# Patient Record
Sex: Female | Born: 1950
Health system: Southern US, Community
[De-identification: ages and names within clinical notes are randomized; demographics above are authoritative.]

## PROBLEM LIST (undated history)

## (undated) DIAGNOSIS — D332 Benign neoplasm of brain, unspecified: Secondary | ICD-10-CM

## (undated) DIAGNOSIS — I10 Essential (primary) hypertension: Secondary | ICD-10-CM

## (undated) DIAGNOSIS — E785 Hyperlipidemia, unspecified: Secondary | ICD-10-CM

## (undated) DIAGNOSIS — F32A Depression, unspecified: Secondary | ICD-10-CM

## (undated) DIAGNOSIS — D649 Anemia, unspecified: Secondary | ICD-10-CM

## (undated) DIAGNOSIS — E559 Vitamin D deficiency, unspecified: Secondary | ICD-10-CM

## (undated) DIAGNOSIS — M170 Bilateral primary osteoarthritis of knee: Secondary | ICD-10-CM

## (undated) DIAGNOSIS — I2510 Atherosclerotic heart disease of native coronary artery without angina pectoris: Secondary | ICD-10-CM

## (undated) DIAGNOSIS — Z780 Asymptomatic menopausal state: Secondary | ICD-10-CM

## (undated) HISTORY — PX: ABDOMINAL HYSTERECTOMY: SHX81

## (undated) HISTORY — DX: Atherosclerotic heart disease of native coronary artery without angina pectoris: I25.10

## (undated) HISTORY — DX: Essential (primary) hypertension: I10

## (undated) HISTORY — DX: Bilateral primary osteoarthritis of knee: M17.0

## (undated) HISTORY — DX: Anemia, unspecified: D64.9

## (undated) HISTORY — DX: Vitamin D deficiency, unspecified: E55.9

## (undated) HISTORY — DX: Benign neoplasm of brain, unspecified: D33.2

## (undated) HISTORY — DX: Hyperlipidemia, unspecified: E78.5

## (undated) HISTORY — DX: Asymptomatic menopausal state: Z78.0

## (undated) HISTORY — DX: Depression, unspecified: F32.A

---

## 1999-02-26 ENCOUNTER — Ambulatory Visit (HOSPITAL_COMMUNITY): Admission: RE | Admit: 1999-02-26 | Discharge: 1999-02-26 | Payer: Self-pay | Admitting: Neurological Surgery

## 1999-02-26 ENCOUNTER — Encounter: Payer: Self-pay | Admitting: Neurological Surgery

## 2006-11-19 ENCOUNTER — Emergency Department (HOSPITAL_COMMUNITY): Admission: EM | Admit: 2006-11-19 | Discharge: 2006-11-19 | Payer: Self-pay | Admitting: Emergency Medicine

## 2006-12-29 ENCOUNTER — Ambulatory Visit: Payer: Self-pay | Admitting: Cardiology

## 2006-12-29 LAB — CONVERTED CEMR LAB
BUN: 16 mg/dL (ref 6–23)
Basophils Absolute: 0 10*3/uL (ref 0.0–0.1)
Basophils Relative: 0.6 % (ref 0.0–1.0)
CO2: 31 meq/L (ref 19–32)
Calcium: 9.3 mg/dL (ref 8.4–10.5)
Chloride: 107 meq/L (ref 96–112)
Creatinine, Ser: 0.8 mg/dL (ref 0.4–1.2)
Eosinophils Absolute: 0.2 10*3/uL (ref 0.0–0.6)
Eosinophils Relative: 2 % (ref 0.0–5.0)
GFR calc Af Amer: 95 mL/min
GFR calc non Af Amer: 79 mL/min
Glucose, Bld: 106 mg/dL — ABNORMAL HIGH (ref 70–99)
HCT: 41.4 % (ref 36.0–46.0)
Hemoglobin: 14.4 g/dL (ref 12.0–15.0)
INR: 1 (ref 0.9–2.0)
Lymphocytes Relative: 27.1 % (ref 12.0–46.0)
MCHC: 34.7 g/dL (ref 30.0–36.0)
MCV: 86.5 fL (ref 78.0–100.0)
Monocytes Absolute: 0.6 10*3/uL (ref 0.2–0.7)
Monocytes Relative: 7 % (ref 3.0–11.0)
Neutro Abs: 5 10*3/uL (ref 1.4–7.7)
Neutrophils Relative %: 63.3 % (ref 43.0–77.0)
Platelets: 315 10*3/uL (ref 150–400)
Potassium: 3.9 meq/L (ref 3.5–5.1)
Prothrombin Time: 12.4 s (ref 10.0–14.0)
RBC: 4.79 M/uL (ref 3.87–5.11)
RDW: 12.4 % (ref 11.5–14.6)
Sodium: 143 meq/L (ref 135–145)
WBC: 8 10*3/uL (ref 4.5–10.5)
aPTT: 31.5 s (ref 26.5–36.5)

## 2007-01-02 ENCOUNTER — Ambulatory Visit: Payer: Self-pay | Admitting: Cardiology

## 2007-01-02 ENCOUNTER — Inpatient Hospital Stay (HOSPITAL_BASED_OUTPATIENT_CLINIC_OR_DEPARTMENT_OTHER): Admission: RE | Admit: 2007-01-02 | Discharge: 2007-01-02 | Payer: Self-pay | Admitting: Cardiology

## 2007-01-21 ENCOUNTER — Ambulatory Visit: Payer: Self-pay | Admitting: *Deleted

## 2008-04-15 ENCOUNTER — Ambulatory Visit (HOSPITAL_COMMUNITY): Admission: RE | Admit: 2008-04-15 | Discharge: 2008-04-15 | Payer: Self-pay | Admitting: Neurology

## 2011-02-01 NOTE — Assessment & Plan Note (Signed)
[facility]ALTHC[facility]                            CARDIOLOGY OFFICE NOTE   Leslie Randall, Leslie Randall [facility]                          MRN:          [id number]  DATE:01/21/2007                            DOB:          [birth year]    This is a patient of Dr. Antoine Poche.  This is a 60 year old married white  female patient who recently underwent cardiac catheterization after an  abnormal stress prerfusion study demonstrating reversible defect in the  anterior and anterior apical wall.  Ejection fraction is 68%.  Cardiac  catheterization performed by Dr. Antoine Poche on January 02, 2007, revealed  normal coronary arteries and normal LV function.  No further cardiac  testing was suggested and she should continue primary risk reduction.   Since the patient has been home she denies any further chest pain.  She  underwent a sleep study last evening and said she also had an EEG  performed.   CURRENT MEDICATIONS:  1. Omacor 2000 mg b.i.d.  2. Hydrochlorothiazide 25 mg daily.  3. Neurontin 300 mg b.i.d.  4. Lopressor 25 mg b.i.d.  5. Aspirin 81 mg daily.  6. Nifedipine 240 mg daily.  7. Benazepril 40 mg daily.   PHYSICAL EXAMINATION:  This is an obese 60 year old white female in no  acute distress.  Blood pressure 120/84, pulse 81, weight 252.  NECK:  Without JVD, HJR, bruit or thyroid enlargement.  LUNGS:  Clear anterior, posterior, and lateral.  HEART:  Regular rate and rhythm at 80 beats per minute, normal S1 and  S2; no murmur, rub, bruit, thrill, or heave noted.  ABDOMEN:  Soft without organomegaly, masses, lesions, or abnormal  tenderness.  Right groin stable without hematoma or hemorrhage, she has good distal  pulses.  EXTREMITIES:  Trace of edema, good distal pulses.   IMPRESSION:  1. Normal coronary arteries and left ventricular function, cardiac      catheterization January 02, 2007.  2. Hypertension.  3. Hyperlipidemia.  4. Borderline diabetes mellitus.   PLAN:  At this  time patient is stable from a cardiac standpoint and can  follow up with [place] p.r.n.      Jacolyn Reedy, PA-C  Electronically Signed      Cecil Cranker, MD, [facility]  Electronically Signed   ML/MedQ  DD: 01/21/2007  DT: 01/21/2007  Job #: [id number]

## 2011-02-01 NOTE — Assessment & Plan Note (Signed)
[facility]ALTHC[facility]                            CARDIOLOGY OFFICE NOTE   Randall, Leslie [facility]                          MRN:          [id number]  DATE:12/29/2006                            DOB:          1951-05-08    PRIMARY CARE PHYSICIAN:  Dr. Leo Grosser.   REASON FOR PRESENTATION:  Evaluate patient with abnormal stress  perfusion study.   HISTORY OF PRESENT ILLNESS:  Patient is a lovely 60 year old white  female with significant cardiovascular risk factors.  She has recently  had difficult to control hypertension.  She has had some exertional  chest discomfort.  Says she will walk up 25-30 yard incline from her  garden to her house and develop a chest tightness.  It is moderate in  intensity.  It does not radiate to her neck or to her arm.  She does get  associated shortness of breath but no nausea or diaphoresis.  She stops  at the top of the hill and it goes away after a couple of minutes.  She  cannot remember how long this has been going on.  It is unlikely any  discomfort she has had before and unlike her previous reflux.  She did  have a stress perfusion study on December 03, 2006, in [facility]  demonstrating a reversible defect in the anterior and anteroapical wall.  The EF was 68%.   The patient had an ER visit in March.  This was following a headache and  a visit to [place], [place], where her systolic blood pressure  was 200.  On that same day, she went home and became confused.  She went  to [facility] [facility] and apparently had work-up that was  negative for acute neurologic events or coronary events.  She was sent  home.  She has had no recurrence of this, though she has had a little  trouble with short-term memory.   PAST MEDICAL HISTORY:  1. Hypertension since 1970s.  2. Hyperlipidemia x18 years.  3. Borderline diabetes.   PAST SURGICAL HISTORY:  1. Hysterectomy.  2. Fatty tumor removed.  3. Carpal tunnel  syndrome.  4. Right knee surgery.   ALLERGIES:  NO KNOWN DRUG ALLERGIES.   MEDICATIONS:  1. Omacor 2000 mg b.i.d.  2. Hydrochlorothiazide 25 mg daily.  3. Neurontin 300 mg b.i.d.  4. Lopressor 25 mg b.i.d.  5. Aspirin 81 mg daily.  6. Nifedipine 240 mg daily.  7. Benazepril 40 mg daily.  8. Ibuprofen.   SOCIAL HISTORY:  The patient works as a Chief Executive Officer.  She is married.  She has three children and four grandchildren.  She smoked for about  five years, quitting in the 1980s.   FAMILY HISTORY:  Contributory for her father dying of myocardial  infarction at 12.   REVIEW OF SYSTEMS:  As stated in the HPI.  Positive for occasional  dizziness, reflux, joint pains, leg cramps, nasal congestion with  questionable sinusitis, cough.  Negative for other systems.   PHYSICAL EXAMINATION:  GENERAL APPEARANCE:  The patient is in no  distress.  VITAL SIGNS:  Blood pressure 178/108, heart rate 73 and regular, weight  252 pounds, body mass index 51.  HEENT:  Eye lids unremarkable.  Pupils are equal, round and reactive to  light.  Fundi within normal limits.  Oral mucosa unremarkable.  NECK:  No jugular venous distension at 45 degrees.  Carotid upstroke  brisk and symmetric, no bruits, no thyromegaly.  LYMPHATICS:  No cervical, axillary or inguinal adenopathy.  LUNGS:  Clear to auscultation bilaterally.  BACK:  No costovertebral angle tenderness.  CHEST:  Unremarkable.  CARDIOVASCULAR:  PMI not displaced or sustained.  S1 and S2 within  normal limits.  No S3, no S4, no clicks, no rubs, no murmurs.  ABDOMEN:  Obese, positive bowel sounds.  Normal in frequency and pitch,  no bruits, no rebound, no guarding, no midline pulsatile mass, no  hepatomegaly, no splenomegaly.  SKIN:  No rashes, no nodules.  EXTREMITIES:  Pulses 2+ throughout.  No clubbing, cyanosis, or edema.  NEUROLOGIC:  Oriented to person, place and time.  Cranial nerves II-XII  grossly intact.  Motor grossly intact  throughout.   EKG with sinus rhythm, rate 73, axis within normal limits, a mild QT  prolongation, poor anterior R-wave progression with questionable  anteroseptal infarct, no acute STT wave change.   ASSESSMENT/PLAN:  1. The patient has exertional chest discomfort and an abnormal stress      test.  The pretest probability of obstructive coronary disease is      extremely high.  Therefore, the next step will be cardiac      catheterization.  Her symptoms have to be considered as unstable      since this is probably new onset, though she cannot recall the      duration.  She will continue on the aspirin.  I will give her      sublingual nitroglycerin.  She will refrain from the activity that      brings on the pain.  I will increase her metoprolol to 50 mg b.i.d.      She knows to call 911 should she have any increasing chest pain.      The plan will be for an outpatient cardiac catheterization.  I have      clearly defined the risks and benefits including stroke, death,      myocardial infarction, dye allergy, renal insufficiency, vascular      trauma, emboli, bleeding and bruising.  The patient agrees to      proceed.  2. Hypertension as above.  Will increase her Lopressor to 50 mg b.i.d.  3. Follow-up will be at the time of the catheterization.     Rollene Rotunda, MD, [facility]  Electronically Signed    JH/MedQ  DD: 12/29/2006  DT: 12/29/2006  Job #: [id number]   cc:   Leo Grosser, M.D.

## 2011-02-01 NOTE — Consult Note (Signed)
NAME:  Leslie Randall, DETLEFSEN NO.:  [id number]   MEDICAL RECORD NO.:  [id number]          PATIENT TYPE:  OIB   LOCATION:  1965                         FACILITY:  [facility]   PHYSICIAN:  Rollene Rotunda, MD, FACCDATE OF BIRTH:  03-30-1951   DATE OF CONSULTATION:  DATE OF DISCHARGE:  01/02/2007                                 CONSULTATION   PRIMARY CARE PHYSICIAN:  Dr. Leo Grosser   PROCEDURES:  Left heart catheterization/coronary arteriography.   INDICATIONS:  Evaluate patient with abnormal Cardiolite suggesting  anteroapical wall ischemia.  She did have new onset exertional chest  discomfort (411.1).   PROCEDURE:  Left heart catheterization was performed via right femoral  artery.  The artery was cannulated using anterior wall puncture.  A #4-  [place] arterial sheath was inserted via the modified Seldinger  technique.  Preformed Judkins and a pigtail catheter were utilized.  The  patient tolerated procedure well and left the lab in stable condition.   RESULTS HEMODYNAMICS:  LV 127/7, AO 127/94.   Coronaries:  Left main was normal.  The LAD was normal and wrapped at  the apex.  First diagonal was moderate sized and normal.  The circumflex  was a dominant vessel and large.  The AV groove was normal.  There was a  tiny ramus intermediate which was normal.  There was a large branching  mid obtuse marginal which was normal.  The PDA was moderate sized and  normal.  The right coronary artery was dominant vessel.  There was a  moderate-sized acute marginal branch which was normal.   Left ventriculogram:  A left ventriculogram was obtained in the RAO  projection.  The EF was 65% with normal wall motion.   CONCLUSION:  Normal coronary arteries.  Normal left function.   PLAN:  No further cardiovascular testing is suggested.  The patient will  have continued primary risk reduction.      Rollene Rotunda, MD, [facility]  Electronically Signed     JH/MEDQ  D:  01/02/2007  T:   01/02/2007  Job:  [id number]   cc:   Leo Grosser, M.D.

## 2011-06-14 LAB — BUN: BUN: 20

## 2020-02-15 DIAGNOSIS — D519 Vitamin B12 deficiency anemia, unspecified: Secondary | ICD-10-CM | POA: Diagnosis not present

## 2020-03-06 ENCOUNTER — Other Ambulatory Visit: Payer: Self-pay

## 2020-03-06 ENCOUNTER — Ambulatory Visit (INDEPENDENT_AMBULATORY_CARE_PROVIDER_SITE_OTHER): Payer: Self-pay | Admitting: Family Medicine

## 2020-03-06 ENCOUNTER — Encounter: Payer: Self-pay | Admitting: Family Medicine

## 2020-03-06 VITALS — BP 139/72 | HR 91 | Temp 97.3°F | Ht 62.5 in | Wt 199.6 lb

## 2020-03-06 DIAGNOSIS — I25119 Atherosclerotic heart disease of native coronary artery with unspecified angina pectoris: Secondary | ICD-10-CM

## 2020-03-06 DIAGNOSIS — D649 Anemia, unspecified: Secondary | ICD-10-CM | POA: Insufficient documentation

## 2020-03-06 DIAGNOSIS — D332 Benign neoplasm of brain, unspecified: Secondary | ICD-10-CM

## 2020-03-06 DIAGNOSIS — R635 Abnormal weight gain: Secondary | ICD-10-CM

## 2020-03-06 DIAGNOSIS — E559 Vitamin D deficiency, unspecified: Secondary | ICD-10-CM | POA: Insufficient documentation

## 2020-03-06 DIAGNOSIS — M1711 Unilateral primary osteoarthritis, right knee: Secondary | ICD-10-CM

## 2020-03-06 DIAGNOSIS — I2510 Atherosclerotic heart disease of native coronary artery without angina pectoris: Secondary | ICD-10-CM | POA: Insufficient documentation

## 2020-03-06 DIAGNOSIS — R9389 Abnormal findings on diagnostic imaging of other specified body structures: Secondary | ICD-10-CM

## 2020-03-06 DIAGNOSIS — E785 Hyperlipidemia, unspecified: Secondary | ICD-10-CM

## 2020-03-06 DIAGNOSIS — J302 Other seasonal allergic rhinitis: Secondary | ICD-10-CM

## 2020-03-06 DIAGNOSIS — F32A Depression, unspecified: Secondary | ICD-10-CM | POA: Insufficient documentation

## 2020-03-06 DIAGNOSIS — I10 Essential (primary) hypertension: Secondary | ICD-10-CM

## 2020-03-06 MED ORDER — FEXOFENADINE HCL 180 MG PO TABS: 180.0000 mg | ORAL_TABLET | Freq: Every day | ORAL | 1 refills | Status: DC

## 2020-03-06 MED ORDER — DICLOFENAC SODIUM 1 % EX GEL: 2.0000 g | Freq: Four times a day (QID) | CUTANEOUS | 1 refills | Status: DC

## 2020-03-06 MED ORDER — CHLORTHALIDONE 25 MG PO TABS: 25.0000 mg | ORAL_TABLET | Freq: Every day | ORAL | 1 refills | Status: DC

## 2020-03-06 MED ORDER — LISINOPRIL 20 MG PO TABS: 20.0000 mg | ORAL_TABLET | Freq: Every day | ORAL | 1 refills | Status: DC

## 2020-03-06 NOTE — Patient Instructions (Signed)
Have your labs completed in the next 1-2 weeks.  We will call you when we receive the results.  I will be in contact about what additional imaging we need of your brain based on the CTA of the Carotids report.  Your medication refills have been sent to your pharmacy on file.  We will plan to see you back in clinic in 3 months for your physical.  You will receive a survey after today's visit either digitally by e-mail or paper by [place] mail. Your experiences and feedback matter to [place].  Please respond so we know how we are doing as we provide care for you.  Call us with any questions/concerns/needs.  It is my goal to be available to you for your health concerns.  Thanks for choosing me to be a partner in your healthcare needs!  Harlin Rain, FNP-C Family Nurse Practitioner [facility] Phone: [phone]

## 2020-03-06 NOTE — Assessment & Plan Note (Signed)
Reports brain tumor, diagnosed in 2008.  No medical records available for review.  Reports met with provider in 2009 and then has not followed up since for treatment or re-evaluation.  Has recently had a CTA of the Carotid showing enhancement in the brain that could be hemangioma/aneurysm, or AVM, advising MRI.  I contacted [facility] for assistance with what imaging would be needed prior to referral.  New patient coordinator, Sharyn Lull, said they would see the patient and then determine what imaging was needed.  Plan: 1. Referral to [facility] for evaluation

## 2020-03-06 NOTE — Assessment & Plan Note (Signed)
Status unknown.  Recheck labs.  Followup after labs.  

## 2020-03-06 NOTE — Progress Notes (Signed)
Subjective:    Patient ID: Leslie Randall, female    DOB: 07-29-1951, 69 y.o.   MRN: [id number]  Leslie Randall is a 70 y.o. female presenting on 03/06/2020 for Establish Care (medication refills )   HPI  Previous PCP is unknown.  Records will not be requested.  Past medical, family, and surgical history reviewed w/ pt.  Leslie Randall is here to establish as a new patient with our clinic.  Has acute concerns for medication refills.  Has brought copies of her most recent office visit note with her cardiology provider.  Reviewed records from Cardiology provider, Dr. Neoma Laming, notes from 02/10/2020 visit.  Notes "CTA Carotid showing no carotid disease, but may have enhancement in brain, could be hemangioma/aneurysm, or AVM, advise MRI".   No flowsheet data found.  Social History   Tobacco Use  . Smoking status: Current Every Day Smoker  . Smokeless tobacco: Current User    Types: Snuff  Vaping Use  . Vaping Use: Never used  Substance Use Topics  . Alcohol use: Not Currently  . Drug use: Never    Review of Systems  Constitutional: Negative.   HENT: Negative.   Eyes: Negative.   Respiratory: Negative.   Cardiovascular: Negative.   Gastrointestinal: Negative.   Endocrine: Negative.   Genitourinary: Negative.   Musculoskeletal: Negative.   Skin: Negative.   Allergic/Immunologic: Negative.   Neurological: Negative.   Hematological: Negative.   Psychiatric/Behavioral: Negative.    Per HPI unless specifically indicated above     Objective:    BP 139/72 (BP Location: Left Arm, Patient Position: Sitting, Cuff Size: Large)   Pulse 91   Temp (!) 97.3 F (36.3 C) (Temporal)   Ht 5' 2.5" (1.588 m)   Wt 199 lb 9.6 oz (90.5 kg)   SpO2 98%   BMI 35.93 kg/m   Wt Readings from Last 3 Encounters:  03/06/20 199 lb 9.6 oz (90.5 kg)    Physical Exam Vitals reviewed.  Constitutional:      General: She is not in acute distress.    Appearance: Normal appearance. She is  well-developed and well-groomed. She is obese. She is not ill-appearing or toxic-appearing.  HENT:     Head: Normocephalic and atraumatic.     Nose:     Comments: Leslie Randall is in place, covering mouth and nose  Eyes:     General: Lids are normal. Vision grossly intact.        Right eye: No discharge.        Left eye: No discharge.     Extraocular Movements: Extraocular movements intact.     Conjunctiva/sclera: Conjunctivae normal.     Pupils: Pupils are equal, round, and reactive to light.  Cardiovascular:     Rate and Rhythm: Normal rate and regular rhythm.     Pulses: Normal pulses.     Heart sounds: Normal heart sounds. No murmur heard.  No friction rub. No gallop.   Pulmonary:     Effort: Pulmonary effort is normal. No respiratory distress.     Breath sounds: Normal breath sounds.  Musculoskeletal:     Right lower leg: No edema.     Left lower leg: No edema.  Skin:    General: Skin is warm and dry.     Capillary Refill: Capillary refill takes less than 2 seconds.  Neurological:     General: No focal deficit present.     Mental Status: She is alert and oriented to person, place,  and time.     Cranial Nerves: No cranial nerve deficit.     Sensory: No sensory deficit.     Motor: No weakness.     Coordination: Coordination normal.     Gait: Gait normal.     Deep Tendon Reflexes: Reflexes normal.  Psychiatric:        Attention and Perception: Attention and perception normal.        Mood and Affect: Mood and affect normal.        Speech: Speech normal.        Behavior: Behavior normal. Behavior is cooperative.        Thought Content: Thought content normal.        Cognition and Memory: Cognition and memory normal.        Judgment: Judgment normal.    Results for orders placed or performed during the hospital encounter of 04/15/08  BUN  Result Value Ref Range   BUN 20   Creatinine, serum  Result Value Ref Range   Creatinine, Ser 0.89    GFR calc non Af Amer >60     GFR calc Af Amer      >60        The eGFR has been calculated using the MDRD equation. This calculation has not been validated in all clinical      Assessment & Plan:   Problem List Items Addressed This Visit      Cardiovascular and Mediastinum   Hypertension    Uncontrolled hypertension.  BP is not at goal < 130/80.  Pt is working on lifestyle modifications.  Needs refills on current hypertension medication and can re-evaluate control.  Plan: 1. Continue taking chlorthalidone 42m daily and lisinopril 279mdaily 2. Obtain labs in the next 2 weeks  3. Encouraged heart healthy diet and increasing exercise to 30 minutes most days of the week, going no more than 2 days in a row without exercise. 4. Check BP 1-2 x per week at home, keep log, and bring to clinic at next appointment. 5. Follow up 3 months.         Relevant Medications   lisinopril (ZESTRIL) 20 MG tablet   chlorthalidone (HYGROTON) 25 MG tablet   Other Relevant Orders   CBC with Differential   COMPLETE METABOLIC PANEL WITH GFR   CAD (coronary artery disease)   Relevant Medications   lisinopril (ZESTRIL) 20 MG tablet   chlorthalidone (HYGROTON) 25 MG tablet     Nervous and Auditory   Brain tumor (benign) ([facility]   Reports brain tumor, diagnosed in 2008.  No medical records available for review.  Reports met with provider in 2009 and then has not followed up since for treatment or re-evaluation.  Has recently had a CTA of the Carotid showing enhancement in the brain that could be hemangioma/aneurysm, or AVM, advising MRI.  [facility] for assistance with what imaging would be needed prior to referral.  New patient coordinator, MiSharyn Lullsaid they would see the patient and then determine what imaging was needed.  Plan: 1. [facility]        Other   Hyperlipidemia    Status unknown.  Recheck labs.  Followup after labs.       Relevant Medications    lisinopril (ZESTRIL) 20 MG tablet   chlorthalidone (HYGROTON) 25 MG tablet   Other Relevant Orders   Lipid Profile   Anemia    Status unknown.  Recheck labs.  Followup after labs.       Vitamin D deficiency    Labs to be drawn for evaluation.      Relevant Orders   VITAMIN D 25 Hydroxy (Vit-D Deficiency, Fractures)    Other Visit Diagnoses    Weight gain    -  Primary   Relevant Orders   Thyroid Panel With TSH   Osteoarthritis of right knee, unspecified osteoarthritis type       Relevant Medications   diclofenac Sodium (VOLTAREN) 1 % GEL   Seasonal allergies       Relevant Medications   fexofenadine (ALLEGRA ALLERGY) 180 MG tablet   Abnormal computed tomography angiography (CTA)       Relevant Orders   Ambulatory referral to Neurosurgery      Meds ordered this encounter  Medications  . lisinopril (ZESTRIL) 20 MG tablet    Sig: Take 1 tablet (20 mg total) by mouth daily.    Dispense:  90 tablet    Refill:  1  . chlorthalidone (HYGROTON) 25 MG tablet    Sig: Take 1 tablet (25 mg total) by mouth daily.    Dispense:  90 tablet    Refill:  1  . diclofenac Sodium (VOLTAREN) 1 % GEL    Sig: Apply 2 g topically 4 (four) times daily.    Dispense:  50 g    Refill:  1  . fexofenadine (ALLEGRA ALLERGY) 180 MG tablet    Sig: Take 1 tablet (180 mg total) by mouth daily.    Dispense:  90 tablet    Refill:  1      Follow up plan: Return in about 3 months (around 06/06/2020) for CPE.   Harlin Rain, [place] Family Nurse Practitioner [facility] 03/06/2020, 4:43 PM

## 2020-03-06 NOTE — Assessment & Plan Note (Signed)
Uncontrolled hypertension.  BP is not at goal < 130/80.  Pt is working on lifestyle modifications.  Needs refills on current hypertension medication and can re-evaluate control.  Plan: 1. Continue taking chlorthalidone 25mg  daily and lisinopril 20mg  daily 2. Obtain labs in the next 2 weeks  3. Encouraged heart healthy diet and increasing exercise to 30 minutes most days of the week, going no more than 2 days in a row without exercise. 4. Check BP 1-2 x per week at home, keep log, and bring to clinic at next appointment. 5. Follow up 3 months.

## 2020-03-06 NOTE — Assessment & Plan Note (Signed)
Labs to be drawn for evaluation.

## 2020-03-07 ENCOUNTER — Other Ambulatory Visit: Payer: Self-pay

## 2020-03-08 ENCOUNTER — Other Ambulatory Visit: Payer: Self-pay

## 2020-03-08 DIAGNOSIS — E559 Vitamin D deficiency, unspecified: Secondary | ICD-10-CM | POA: Diagnosis not present

## 2020-03-08 DIAGNOSIS — R635 Abnormal weight gain: Secondary | ICD-10-CM | POA: Diagnosis not present

## 2020-03-08 DIAGNOSIS — E785 Hyperlipidemia, unspecified: Secondary | ICD-10-CM | POA: Diagnosis not present

## 2020-03-08 DIAGNOSIS — I10 Essential (primary) hypertension: Secondary | ICD-10-CM | POA: Diagnosis not present

## 2020-03-09 ENCOUNTER — Other Ambulatory Visit: Payer: Self-pay | Admitting: Family Medicine

## 2020-03-09 DIAGNOSIS — E785 Hyperlipidemia, unspecified: Secondary | ICD-10-CM

## 2020-03-09 DIAGNOSIS — E559 Vitamin D deficiency, unspecified: Secondary | ICD-10-CM

## 2020-03-09 DIAGNOSIS — R7989 Other specified abnormal findings of blood chemistry: Secondary | ICD-10-CM

## 2020-03-09 LAB — COMPLETE METABOLIC PANEL WITH GFR
AG Ratio: 1.8 (calc) (ref 1.0–2.5)
ALT: 12 U/L (ref 6–29)
AST: 17 U/L (ref 10–35)
Albumin: 4.1 g/dL (ref 3.6–5.1)
Alkaline phosphatase (APISO): 60 U/L (ref 37–153)
BUN/Creatinine Ratio: 16 (calc) (ref 6–22)
BUN: 15 mg/dL (ref 7–25)
CO2: 29 mmol/L (ref 20–32)
Calcium: 9.6 mg/dL (ref 8.6–10.4)
Chloride: 103 mmol/L (ref 98–110)
Creat: 0.95 mg/dL — ABNORMAL HIGH (ref 0.60–0.93)
GFR, Est African American: 70 mL/min/{1.73_m2} (ref >=60)
GFR, Est Non African American: 61 mL/min/{1.73_m2} (ref >=60)
Globulin: 2.3 g/dL (calc) (ref 1.9–3.7)
Glucose, Bld: 104 mg/dL — ABNORMAL HIGH (ref 65–99)
Potassium: 3.5 mmol/L (ref 3.5–5.3)
Sodium: 141 mmol/L (ref 135–146)
Total Bilirubin: 1.3 mg/dL — ABNORMAL HIGH (ref 0.2–1.2)
Total Protein: 6.4 g/dL (ref 6.1–8.1)

## 2020-03-09 LAB — CBC WITH DIFFERENTIAL/PLATELET
Absolute Monocytes: 401 cells/uL (ref 200–950)
Basophils Absolute: 30 cells/uL (ref 0–200)
Basophils Relative: 0.5 %
Eosinophils Absolute: 100 cells/uL (ref 15–500)
Eosinophils Relative: 1.7 %
HCT: 39.3 % (ref 35.0–45.0)
Hemoglobin: 13.5 g/dL (ref 11.7–15.5)
Lymphs Abs: 1977 cells/uL (ref 850–3900)
MCH: 30.7 pg (ref 27.0–33.0)
MCHC: 34.4 g/dL (ref 32.0–36.0)
MCV: 89.3 fL (ref 80.0–100.0)
MPV: 10 fL (ref 7.5–12.5)
Monocytes Relative: 6.8 %
Neutro Abs: 3393 cells/uL (ref 1500–7800)
Neutrophils Relative %: 57.5 %
Platelets: 274 10*3/uL (ref 140–400)
RBC: 4.4 10*6/uL (ref 3.80–5.10)
RDW: 12.9 % (ref 11.0–15.0)
Total Lymphocyte: 33.5 %
WBC: 5.9 10*3/uL (ref 3.8–10.8)

## 2020-03-09 LAB — LIPID PANEL
Cholesterol: 217 mg/dL — ABNORMAL HIGH (ref <200)
HDL: 53 mg/dL (ref >=50)
LDL Cholesterol (Calc): 135 mg/dL (calc) — ABNORMAL HIGH
Non-HDL Cholesterol (Calc): 164 mg/dL (calc) — ABNORMAL HIGH (ref <130)
Total CHOL/HDL Ratio: 4.1 (calc) (ref <5.0)
Triglycerides: 158 mg/dL — ABNORMAL HIGH (ref <150)

## 2020-03-09 LAB — THYROID PANEL WITH TSH
Free Thyroxine Index: 3.2 (ref 1.4–3.8)
T3 Uptake: 30 % (ref 22–35)
T4, Total: 10.6 ug/dL (ref 5.1–11.9)
TSH: 0.09 mIU/L — ABNORMAL LOW (ref 0.40–4.50)

## 2020-03-09 LAB — VITAMIN D 25 HYDROXY (VIT D DEFICIENCY, FRACTURES): Vit D, 25-Hydroxy: 21 ng/mL — ABNORMAL LOW (ref 30–100)

## 2020-03-09 MED ORDER — ROSUVASTATIN CALCIUM 10 MG PO TABS: 10.0000 mg | ORAL_TABLET | Freq: Every day | ORAL | 3 refills | Status: DC

## 2020-03-13 ENCOUNTER — Other Ambulatory Visit: Payer: Medicare HMO

## 2020-03-13 ENCOUNTER — Other Ambulatory Visit: Payer: Self-pay

## 2020-03-13 DIAGNOSIS — R7989 Other specified abnormal findings of blood chemistry: Secondary | ICD-10-CM | POA: Diagnosis not present

## 2020-03-14 ENCOUNTER — Other Ambulatory Visit: Payer: Self-pay | Admitting: Family Medicine

## 2020-03-14 DIAGNOSIS — E0590 Thyrotoxicosis, unspecified without thyrotoxic crisis or storm: Secondary | ICD-10-CM

## 2020-03-14 HISTORY — DX: Thyrotoxicosis, unspecified without thyrotoxic crisis or storm: E05.90

## 2020-03-14 LAB — THYROID PANEL WITH TSH
Free Thyroxine Index: 3.3 (ref 1.4–3.8)
T3 Uptake: 32 % (ref 22–35)
T4, Total: 10.2 ug/dL (ref 5.1–11.9)
TSH: 0.09 mIU/L — ABNORMAL LOW (ref 0.40–4.50)

## 2020-03-15 DIAGNOSIS — R413 Other amnesia: Secondary | ICD-10-CM | POA: Diagnosis not present

## 2020-03-15 DIAGNOSIS — Q279 Congenital malformation of peripheral vascular system, unspecified: Secondary | ICD-10-CM | POA: Diagnosis not present

## 2020-03-23 ENCOUNTER — Other Ambulatory Visit: Payer: Self-pay | Admitting: Neurological Surgery

## 2020-03-23 DIAGNOSIS — Q279 Congenital malformation of peripheral vascular system, unspecified: Secondary | ICD-10-CM

## 2020-04-07 ENCOUNTER — Other Ambulatory Visit
Admission: RE | Admit: 2020-04-07 | Discharge: 2020-04-07 | Disposition: A | Discharge status: Home / Self Care | Payer: Medicare HMO | Source: Home / Self Care | Attending: Family Medicine | Admitting: Family Medicine

## 2020-04-07 ENCOUNTER — Other Ambulatory Visit: Payer: Self-pay

## 2020-04-07 ENCOUNTER — Ambulatory Visit
Admission: RE | Admit: 2020-04-07 | Discharge: 2020-04-07 | Disposition: A | Discharge status: Home / Self Care | Payer: Medicare HMO | Source: Ambulatory Visit | Attending: Neurological Surgery | Admitting: Neurological Surgery

## 2020-04-07 DIAGNOSIS — D329 Benign neoplasm of meninges, unspecified: Secondary | ICD-10-CM | POA: Diagnosis not present

## 2020-04-07 DIAGNOSIS — Q279 Congenital malformation of peripheral vascular system, unspecified: Secondary | ICD-10-CM | POA: Diagnosis not present

## 2020-04-07 DIAGNOSIS — I6782 Cerebral ischemia: Secondary | ICD-10-CM | POA: Diagnosis not present

## 2020-04-07 DIAGNOSIS — I6389 Other cerebral infarction: Secondary | ICD-10-CM | POA: Diagnosis not present

## 2020-04-07 DIAGNOSIS — G319 Degenerative disease of nervous system, unspecified: Secondary | ICD-10-CM | POA: Diagnosis not present

## 2020-04-07 LAB — COMPREHENSIVE METABOLIC PANEL
ALT: 15 U/L (ref 0–44)
AST: 22 U/L (ref 15–41)
Albumin: 4.2 g/dL (ref 3.5–5.0)
Alkaline Phosphatase: 54 U/L (ref 38–126)
Anion gap: 12 (ref 5–15)
BUN: 16 mg/dL (ref 8–23)
CO2: 25 mmol/L (ref 22–32)
Calcium: 10 mg/dL (ref 8.9–10.3)
Chloride: 103 mmol/L (ref 98–111)
Creatinine, Ser: 1.1 mg/dL — ABNORMAL HIGH (ref 0.44–1.00)
GFR calc Af Amer: 59 mL/min — ABNORMAL LOW (ref >60)
GFR calc non Af Amer: 51 mL/min — ABNORMAL LOW (ref >60)
Glucose, Bld: 122 mg/dL — ABNORMAL HIGH (ref 70–99)
Potassium: 3.2 mmol/L — ABNORMAL LOW (ref 3.5–5.1)
Sodium: 140 mmol/L (ref 135–145)
Total Bilirubin: 1.5 mg/dL — ABNORMAL HIGH (ref 0.3–1.2)
Total Protein: 7.2 g/dL (ref 6.5–8.1)

## 2020-04-07 MED ORDER — GADOBUTROL 1 MMOL/ML IV SOLN
9.0000 mL | Freq: Once | INTRAVENOUS | Status: AC | PRN
  Administered 2020-04-07: 9 mL via INTRAVENOUS

## 2020-04-18 DIAGNOSIS — R900 Intracranial space-occupying lesion found on diagnostic imaging of central nervous system: Secondary | ICD-10-CM | POA: Diagnosis not present

## 2020-06-08 ENCOUNTER — Ambulatory Visit (INDEPENDENT_AMBULATORY_CARE_PROVIDER_SITE_OTHER): Payer: Medicare HMO | Admitting: Family Medicine

## 2020-06-08 ENCOUNTER — Other Ambulatory Visit: Payer: Self-pay

## 2020-06-08 ENCOUNTER — Encounter: Payer: Self-pay | Admitting: Family Medicine

## 2020-06-08 VITALS — BP 132/72 | HR 89 | Temp 98.3°F | Ht 62.5 in | Wt 200.4 lb

## 2020-06-08 DIAGNOSIS — F329 Major depressive disorder, single episode, unspecified: Secondary | ICD-10-CM | POA: Diagnosis not present

## 2020-06-08 DIAGNOSIS — Z23 Encounter for immunization: Secondary | ICD-10-CM

## 2020-06-08 DIAGNOSIS — F419 Anxiety disorder, unspecified: Secondary | ICD-10-CM

## 2020-06-08 DIAGNOSIS — Z0000 Encounter for general adult medical examination without abnormal findings: Secondary | ICD-10-CM | POA: Diagnosis not present

## 2020-06-08 DIAGNOSIS — R739 Hyperglycemia, unspecified: Secondary | ICD-10-CM | POA: Diagnosis not present

## 2020-06-08 DIAGNOSIS — D332 Benign neoplasm of brain, unspecified: Secondary | ICD-10-CM | POA: Diagnosis not present

## 2020-06-08 DIAGNOSIS — I10 Essential (primary) hypertension: Secondary | ICD-10-CM

## 2020-06-08 DIAGNOSIS — Z5320 Procedure and treatment not carried out because of patient's decision for unspecified reasons: Secondary | ICD-10-CM | POA: Diagnosis not present

## 2020-06-08 DIAGNOSIS — F32A Depression, unspecified: Secondary | ICD-10-CM

## 2020-06-08 LAB — POCT URINALYSIS DIPSTICK
Bilirubin, UA: NEGATIVE
Blood, UA: NEGATIVE
Glucose, UA: NEGATIVE
Ketones, UA: NEGATIVE
Leukocytes, UA: NEGATIVE
Nitrite, UA: NEGATIVE
Protein, UA: NEGATIVE
Spec Grav, UA: 1.02 (ref 1.010–1.025)
Urobilinogen, UA: 0.2 E.U./dL
pH, UA: 5 (ref 5.0–8.0)

## 2020-06-08 LAB — POCT GLYCOSYLATED HEMOGLOBIN (HGB A1C): Hemoglobin A1C: 5.5 % (ref 4.0–5.6)

## 2020-06-08 MED ORDER — HYDROXYZINE HCL 10 MG PO TABS: 10.0000 mg | ORAL_TABLET | Freq: Three times a day (TID) | ORAL | 1 refills | Status: DC | PRN

## 2020-06-08 NOTE — Progress Notes (Signed)
Subjective:    Patient ID: Leslie Randall, female    DOB: Dec 11, 1950, 69 y.o.   MRN: [id number]  Leslie Randall is a 69 y.o. female presenting on 06/08/2020 for Annual Exam   HPI   HEALTH MAINTENANCE:  Weight/BMI: Obese, BMI 36.07% Physical activity: Sedentary Diet: Regular Seatbelt: Always Sunscreen: No, reports does not go in the sun Mammogram: Overdue, discussed, reports is relocating to [place] (06/2020) and would like to wait until she has relocated DEXA: Overdue, discussed, wants to wait until relocates to [place] screening: Overdue, discussed, would like to wait until relocates to [place]: Offered and declined GC/CT: Offered and declined Optometry: Not yet established locally, moving to [place] Dentistry: Not yet established locally, moving to [place]   Depression screen PHQ 2/9 06/08/2020  Decreased Interest 0  Down, Depressed, Hopeless 1  PHQ - 2 Score 1  Altered sleeping 1  Tired, decreased energy 1  Change in appetite 0  Feeling bad or failure about yourself  0  Trouble concentrating 0  Moving slowly or fidgety/restless 0  Suicidal thoughts 0  PHQ-9 Score 3  Difficult doing work/chores Not difficult at all    Past Medical History:  Diagnosis Date  . Anemia   . Brain tumor (benign) ([place])   . CAD (coronary artery disease)   . Depression   . Hyperlipidemia   . Hypertension   . Menopause   . Osteoarthritis of both knees   . Vitamin D deficiency    Past Surgical History:  Procedure Laterality Date  . ABDOMINAL HYSTERECTOMY     Social History   Socioeconomic History  . Marital status: Married    Spouse name: Not on file  . Number of children: Not on file  . Years of education: Not on file  . Highest education level: Not on file  Occupational History  . Not on file  Tobacco Use  . Smoking status: Former Research scientist (life sciences)  . Smokeless tobacco: Current User    Types: Snuff  Vaping Use  . Vaping Use: Never used  Substance and Sexual  Activity  . Alcohol use: Not Currently  . Drug use: Never  . Sexual activity: Not on file  Other Topics Concern  . Not on file  Social History Narrative  . Not on file   Social Determinants of Health   Financial Resource Strain:   . Difficulty of Paying Living Expenses: Not on file  Food Insecurity:   . Worried About Charity fundraiser in the Last Year: Not on file  . Ran Out of Food in the Last Year: Not on file  Transportation Needs:   . Lack of Transportation (Medical): Not on file  . Lack of Transportation (Non-Medical): Not on file  Physical Activity:   . Days of Exercise per Week: Not on file  . Minutes of Exercise per Session: Not on file  Stress:   . Feeling of Stress : Not on file  Social Connections:   . Frequency of Communication with Friends and Family: Not on file  . Frequency of Social Gatherings with Friends and Family: Not on file  . Attends Religious Services: Not on file  . Active Member of Clubs or Organizations: Not on file  . Attends Archivist Meetings: Not on file  . Marital Status: Not on file  Intimate Partner Violence:   . Fear of Current or Ex-Partner: Not on file  . Emotionally Abused: Not on file  .  Physically Abused: Not on file  . Sexually Abused: Not on file   Family History  Problem Relation Age of Onset  . Heart disease Mother   . Stroke Father   . Diabetes Father    Current Outpatient Medications on File Prior to Visit  Medication Sig  . Biotin 1 MG CAPS Take by mouth.  . calcium carbonate (OS-CAL) 1250 (500 Ca) MG chewable tablet Chew 1 tablet by mouth daily.  . chlorthalidone (HYGROTON) 25 MG tablet Take 1 tablet (25 mg total) by mouth daily.  . diclofenac Sodium (VOLTAREN) 1 % GEL Apply 2 g topically 4 (four) times daily.  . fexofenadine (ALLEGRA ALLERGY) 180 MG tablet Take 1 tablet (180 mg total) by mouth daily.  Marland Kitchen lisinopril (ZESTRIL) 20 MG tablet Take 1 tablet (20 mg total) by mouth daily.  . rosuvastatin (CRESTOR)  10 MG tablet Take 1 tablet (10 mg total) by mouth daily.  . vitamin B-12 (CYANOCOBALAMIN) 500 MCG tablet Take 500 mcg by mouth daily.  . Vitamin D, Ergocalciferol, 50 MCG (2000 UT) CAPS Take by mouth.  . vitamin E 1000 UNIT capsule Take 1,000 Units by mouth daily.   No current facility-administered medications on file prior to visit.    Per HPI unless specifically indicated above     Objective:    BP 132/72 (BP Location: Left Arm, Patient Position: Sitting, Cuff Size: Large)   Pulse 89   Temp 98.3 F (36.8 C) (Oral)   Ht 5' 2.5" (1.588 m)   Wt 200 lb 6.4 oz (90.9 kg)   BMI 36.07 kg/m   Wt Readings from Last 3 Encounters:  06/08/20 200 lb 6.4 oz (90.9 kg)  03/06/20 199 lb 9.6 oz (90.5 kg)    Physical Exam Vitals reviewed.  Constitutional:      General: She is not in acute distress.    Appearance: Normal appearance. She is well-developed and well-groomed. She is obese. She is not ill-appearing or toxic-appearing.  HENT:     Head: Normocephalic and atraumatic.     Right Ear: Tympanic membrane, ear canal and external ear normal. There is no impacted cerumen.     Left Ear: Tympanic membrane, ear canal and external ear normal. There is no impacted cerumen.     Nose: Nose normal. No congestion or rhinorrhea.     Mouth/Throat:     Lips: Pink.     Mouth: Mucous membranes are moist.     Pharynx: Oropharynx is clear. Uvula midline. No oropharyngeal exudate or posterior oropharyngeal erythema.  Eyes:     General: Lids are normal. Vision grossly intact. No scleral icterus.       Right eye: No discharge.        Left eye: No discharge.     Extraocular Movements: Extraocular movements intact.     Conjunctiva/sclera: Conjunctivae normal.     Pupils: Pupils are equal, round, and reactive to light.  Neck:     Thyroid: No thyroid mass or thyromegaly.  Cardiovascular:     Rate and Rhythm: Normal rate and regular rhythm.     Pulses: Normal pulses.          Dorsalis pedis pulses are 2+ on  the right side and 2+ on the left side.     Heart sounds: Normal heart sounds. No murmur heard.  No friction rub. No gallop.   Pulmonary:     Effort: Pulmonary effort is normal. No respiratory distress.     Breath sounds: Normal breath sounds.  Abdominal:     General: Abdomen is flat. Bowel sounds are normal. There is no distension.     Palpations: Abdomen is soft. There is no mass.     Tenderness: There is no abdominal tenderness. There is no guarding or rebound.     Hernia: No hernia is present.     Comments: Unable to palpate for hepatomegaly or splenomegaly due to body habitus  Musculoskeletal:        General: Normal range of motion.     Cervical back: Normal range of motion and neck supple. No tenderness.     Right lower leg: No edema.     Left lower leg: No edema.     Comments: Normal tone, strength 5/5 BUE & BLE  Feet:     Right foot:     Skin integrity: Skin integrity normal.     Left foot:     Skin integrity: Skin integrity normal.  Lymphadenopathy:     Cervical: No cervical adenopathy.  Skin:    General: Skin is warm and dry.     Capillary Refill: Capillary refill takes less than 2 seconds.  Neurological:     General: No focal deficit present.     Mental Status: She is alert and oriented to person, place, and time.     Cranial Nerves: No cranial nerve deficit.     Sensory: No sensory deficit.     Motor: No weakness.     Coordination: Coordination normal.     Gait: Gait normal.     Deep Tendon Reflexes: Reflexes normal.  Psychiatric:        Attention and Perception: Attention and perception normal.        Mood and Affect: Mood and affect normal.        Speech: Speech normal.        Behavior: Behavior normal. Behavior is cooperative.        Thought Content: Thought content normal.        Cognition and Memory: Cognition and memory normal.        Judgment: Judgment normal.     Results for orders placed or performed in visit on 06/08/20  POCT glycosylated  hemoglobin (Hb A1C)  Result Value Ref Range   Hemoglobin A1C 5.5 4.0 - 5.6 %   HbA1c POC (<> result, manual entry)     HbA1c, POC (prediabetic range)     HbA1c, POC (controlled diabetic range)    POCT Urinalysis Dipstick  Result Value Ref Range   Color, UA Yellow    Clarity, UA clear    Glucose, UA Negative Negative   Bilirubin, UA negative    Ketones, UA negative    Spec Grav, UA 1.020 1.010 - 1.025   Blood, UA negative    pH, UA 5.0 5.0 - 8.0   Protein, UA Negative Negative   Urobilinogen, UA 0.2 0.2 or 1.0 E.U./dL   Nitrite, UA negative    Leukocytes, UA Negative Negative   Appearance     Odor        Assessment & Plan:   Problem List Items Addressed This Visit      Cardiovascular and Mediastinum   Hypertension   Relevant Orders   POCT Urinalysis Dipstick (Completed)     Nervous and Auditory   Brain tumor (benign) ([place])    Has re-established with [facility].  Reports they had did additional imaging and was told that it had remained stable, if she had any increased falls, headaches,  visual changes to return to see them otherwise no scheduled follow up        Other   Depression    PHQ9-3/GAD7-3.  Denies any concerns with depression currently but believes may benefit from a PRN medication for anxiety.  Mood handout provided.  Plan: 1. Begin hydroxyzine 10mg  TID PRN for anxiety 2. RTC in 4 weeks      Relevant Medications   hydrOXYzine (ATARAX/VISTARIL) 10 MG tablet   Hyperglycemia    Elevated glucose on lab draw.  POCT A1C completed in clinic 5.5%.  Reviewed with patient, likely not full fast before last set of labs.      Relevant Orders   POCT glycosylated hemoglobin (Hb A1C) (Completed)   Flu vaccine need    Pt > age 5.  Needs annual influenza vaccine.  VIS provided.  Plan: 1. Administer Quad flu vaccine.       Anxiety    See depression A/P      Relevant Medications   hydrOXYzine (ATARAX/VISTARIL) 10 MG tablet   Annual physical exam -  Primary    Annual physical exam without new findings.  Well adult with no acute concerns.  Plan: 1. Obtain health maintenance screenings as above according to age. - Increase physical activity to 30 minutes most days of the week.  - Eat healthy diet high in vegetables and fruits; low in refined carbohydrates. - Screening labs and tests as ordered 2. Return 1 year for annual physical.       Breast screening declined    Discussed is overdue for Mammo, no records on file for review.  Patient reports she is likely relocating to [place] next month (06/2020) and would like to defer this until she has relocated.      Colon cancer screening declined    Discussed is overdue for colon cancer screening, no records on file for review.  Patient reports she is likely relocating to [place] next month (06/2020) and would like to defer this until she has relocated.         Meds ordered this encounter  Medications  . hydrOXYzine (ATARAX/VISTARIL) 10 MG tablet    Sig: Take 1 tablet (10 mg total) by mouth 3 (three) times daily as needed.    Dispense:  90 tablet    Refill:  1    Follow up plan: Return in about 3 months (around 09/07/2020) for HTN, Anxiety follow up.  Harlin Rain, FNP-C Family Nurse Practitioner [facility] 06/08/2020, 3:09 PM

## 2020-06-08 NOTE — Patient Instructions (Signed)
Well Visit: Care Instructions Overview  Well visits can help you stay healthy. Your provider has checked your overall health and may have suggested ways to take good care of yourself. Your provider also may have recommended tests. At home, you can help prevent illness with healthy eating, regular exercise, and other steps.  Follow-up care is a key part of your treatment and safety. Be sure to make and go to all appointments, and call your provider if you are having problems. It's also a good idea to know your test results and keep a list of the medicines you take.  How can you care for yourself at home?   Get screening tests that you and your doctor decide on. Screening helps find diseases before any symptoms appear.   Eat healthy foods. Choose fruits, vegetables, whole grains, protein, and low-fat dairy foods. Limit fat, especially saturated fat. Reduce salt in your diet.   Limit alcohol. If you are a man, have no more than 2 drinks a day or 14 drinks a week. If you are a woman, have no more than 1 drink a day or 7 drinks a week.   Get at least 30 minutes of physical activity on most days of the week.  We recommend you go no more than 2 days in a row without exercise. Walking is a good choice. You also may want to do other activities, such as running, swimming, cycling, or playing tennis or team sports. Discuss any changes in your exercise program with your provider.   Reach and stay at a healthy weight. This will lower your risk for many problems, such as obesity, diabetes, heart disease, and high blood pressure.   Do not smoke or allow others to smoke around you. If you need help quitting, talk to your provider about stop-smoking programs and medicines. These can increase your chances of quitting for good.  Can call 1-800-QUIT-NOW (1-800-784-8669) for the [place] Quitline, assistance with smoking cessation.   Care for your mental health. It is easy to get weighed down by worry  and stress. Learn strategies to manage stress, like deep breathing and mindfulness, and stay connected with your family and community. If you find you often feel sad or hopeless, talk with your provider. Treatment can help.   Talk to your provider about whether you have any risk factors for sexually transmitted infections (STIs). You can help prevent STIs if you wait to have sex with a new partner (or partners) until you've each been tested for STIs. It also helps if you use condoms (female or female condoms) and if you limit your sex partners to one person who only has sex with you. Vaccines are available for some STIs, such as HPV (these are age dependent).   If you think you may have a problem with alcohol or drug use, talk to your provider. This includes prescription medicines (such as amphetamines and opioids) and illegal drugs (such as cocaine and methamphetamine). Your provider can help you figure out what type of treatment is best for you.   If you have concerns about domestic violence or intimate partner violence, there are resources available to you. National Domestic Abuse Hotline 1-800-7233   Protect your skin from too much sun. When you're outdoors from 10 a.m. to 4 p.m., stay in the shade or cover up with clothing and a hat with a wide brim. Wear sunglasses that block UV rays. Even when it's cloudy, put broad-spectrum sunscreen (SPF 30 or higher) on any   exposed skin.   See a dentist one or two times a year for checkups and to have your teeth cleaned.   See an eye doctor once per year for an eye exam.   Wear a seat belt in the car.  When should you call for help?  Watch closely for changes in your health, and be sure to contact your provider if you have any problems or symptoms that concern you.  We will plan to see you back in 3 months for hypertension and anxiety follow up visit if you are still in [place].  If you have relocated to [place], please let us know and we can  send you records  You will receive a survey after today's visit either digitally by e-mail or paper by [employer]. Your experiences and feedback matter to [place].  Please respond so we know how we are doing as we provide care for you.  Call us with any questions/concerns/needs.  It is my goal to be available to you for your health concerns.  Thanks for choosing me to be a partner in your healthcare needs!  Harlin Rain, FNP-C Family Nurse Practitioner [facility] Phone: [phone]

## 2020-06-12 DIAGNOSIS — Z5320 Procedure and treatment not carried out because of patient's decision for unspecified reasons: Secondary | ICD-10-CM | POA: Insufficient documentation

## 2020-06-12 DIAGNOSIS — F419 Anxiety disorder, unspecified: Secondary | ICD-10-CM | POA: Insufficient documentation

## 2020-06-12 DIAGNOSIS — F32A Depression, unspecified: Secondary | ICD-10-CM

## 2020-06-12 DIAGNOSIS — R739 Hyperglycemia, unspecified: Secondary | ICD-10-CM | POA: Insufficient documentation

## 2020-06-12 DIAGNOSIS — Z23 Encounter for immunization: Secondary | ICD-10-CM | POA: Insufficient documentation

## 2020-06-12 DIAGNOSIS — Z0000 Encounter for general adult medical examination without abnormal findings: Secondary | ICD-10-CM | POA: Insufficient documentation

## 2020-06-12 HISTORY — DX: Depression, unspecified: F32.A

## 2020-06-12 NOTE — Assessment & Plan Note (Signed)
See depression A/P. 

## 2020-06-12 NOTE — Assessment & Plan Note (Signed)
Pt < age 69.  Needs annual influenza vaccine.  VIS provided.  Plan: 1. Administer Quad flu vaccine.  

## 2020-06-12 NOTE — Assessment & Plan Note (Signed)
Discussed is overdue for Mammo, no records on file for review.  Patient reports she is likely relocating to [place] next month (06/2020) and would like to defer this until she has relocated.

## 2020-06-12 NOTE — Assessment & Plan Note (Signed)
Has re-established with [facility].  Reports they had did additional imaging and was told that it had remained stable, if she had any increased falls, headaches, visual changes to return to see them otherwise no scheduled follow up

## 2020-06-12 NOTE — Assessment & Plan Note (Addendum)
PHQ9-3/GAD7-3.  Denies any concerns with depression currently but believes may benefit from a PRN medication for anxiety.  Mood handout provided.  Plan: 1. Begin hydroxyzine 10mg  TID PRN for anxiety 2. RTC in 4 weeks

## 2020-06-12 NOTE — Assessment & Plan Note (Signed)
Discussed is overdue for colon cancer screening, no records on file for review.  Patient reports she is likely relocating to [place] next month (06/2020) and would like to defer this until she has relocated.

## 2020-06-12 NOTE — Assessment & Plan Note (Signed)
Elevated glucose on lab draw.  POCT A1C completed in clinic 5.5%.  Reviewed with patient, likely not full fast before last set of labs.

## 2020-06-12 NOTE — Assessment & Plan Note (Signed)
Annual physical exam without new findings.  Well adult with no acute concerns.  Plan: 1. Obtain health maintenance screenings as above according to age. - Increase physical activity to 30 minutes most days of the week.  - Eat healthy diet high in vegetables and fruits; low in refined carbohydrates. - Screening labs and tests as ordered 2. Return 1 year for annual physical.  

## 2020-09-04 ENCOUNTER — Other Ambulatory Visit: Payer: Self-pay | Admitting: Family Medicine

## 2020-09-04 DIAGNOSIS — I10 Essential (primary) hypertension: Secondary | ICD-10-CM

## 2020-09-22 ENCOUNTER — Other Ambulatory Visit: Payer: Self-pay | Admitting: Family Medicine

## 2020-09-22 DIAGNOSIS — I10 Essential (primary) hypertension: Secondary | ICD-10-CM

## 2020-09-22 DIAGNOSIS — E785 Hyperlipidemia, unspecified: Secondary | ICD-10-CM

## 2020-09-22 DIAGNOSIS — J302 Other seasonal allergic rhinitis: Secondary | ICD-10-CM

## 2020-09-22 MED ORDER — ROSUVASTATIN CALCIUM 10 MG PO TABS: 10.0000 mg | ORAL_TABLET | Freq: Every day | ORAL | 1 refills | Status: DC

## 2020-09-22 MED ORDER — FEXOFENADINE HCL 180 MG PO TABS: 180.0000 mg | ORAL_TABLET | Freq: Every day | ORAL | 0 refills | Status: DC

## 2020-09-22 NOTE — Telephone Encounter (Signed)
Requested medications are due for refill today yes  Requested medications are on the active medication list yes  Last visit 05/2020   Future visit scheduled yes 09/25/2020  Notes to clinic These two meds do not have signatures, previously have been 1 a day., appt next week.

## 2020-09-22 NOTE — Telephone Encounter (Signed)
Medication Refill - Medication: rosuvastatin (CRESTOR) 10 MG tablet,fexofenadine (ALLEGRA ALLERGY) 180 MG tablet ,lisinopril (ZESTRIL) 20 MG tablet ,chlorthalidone (HYGROTON) 25 MG tablet   Has the patient contacted their pharmacy? yes (Agent: If no, request that the patient contact the pharmacy for the refill.) (Agent: If yes, when and what did the pharmacy advise?)Contact pcp  Preferred Pharmacy (with phone number or street name):  [facility] [place], [place] Phone:  [phone]  Fax:  [phone]       Agent: Please be advised that RX refills may take up to 3 business days. We ask that you follow-up with your pharmacy.

## 2020-09-25 ENCOUNTER — Ambulatory Visit (INDEPENDENT_AMBULATORY_CARE_PROVIDER_SITE_OTHER): Payer: Medicare HMO | Admitting: Family Medicine

## 2020-09-25 ENCOUNTER — Encounter: Payer: Self-pay | Admitting: Family Medicine

## 2020-09-25 ENCOUNTER — Other Ambulatory Visit: Payer: Self-pay

## 2020-09-25 DIAGNOSIS — E785 Hyperlipidemia, unspecified: Secondary | ICD-10-CM | POA: Diagnosis not present

## 2020-09-25 DIAGNOSIS — I10 Essential (primary) hypertension: Secondary | ICD-10-CM

## 2020-09-25 DIAGNOSIS — F419 Anxiety disorder, unspecified: Secondary | ICD-10-CM | POA: Diagnosis not present

## 2020-09-25 MED ORDER — CHLORTHALIDONE 25 MG PO TABS: 25.0000 mg | ORAL_TABLET | Freq: Every day | ORAL | 0 refills | Status: DC

## 2020-09-25 MED ORDER — HYDROXYZINE HCL 10 MG PO TABS
10.0000 mg | ORAL_TABLET | Freq: Three times a day (TID) | ORAL | 1 refills | Status: DC | PRN
Start: 2020-09-25 — End: 2021-03-21

## 2020-09-25 MED ORDER — ROSUVASTATIN CALCIUM 10 MG PO TABS: 10.0000 mg | ORAL_TABLET | Freq: Every day | ORAL | 1 refills | Status: DC

## 2020-09-25 MED ORDER — LISINOPRIL 20 MG PO TABS: 20.0000 mg | ORAL_TABLET | Freq: Every day | ORAL | 0 refills | Status: DC

## 2020-09-25 NOTE — Assessment & Plan Note (Signed)
Refills on rosuvastatin sent to pharmacy.  To continue and repeat labs in 3 months.

## 2020-09-25 NOTE — Progress Notes (Signed)
Subjective:    Patient ID: Leslie Randall, female    DOB: 1950/12/11, 70 y.o.   MRN: [id number]  Leslie Randall is a 70 y.o. female presenting on 09/25/2020 for Hypertension   HPI  Hypertension - She is not checking BP at home or outside of clinic.    - Current medications: chlorthalidone 25mg  daily and lisinopril 20mg  daily, has not been taking, has run out of her prescription - She is not currently symptomatic. - Pt denies headache, lightheadedness, dizziness, changes in vision, chest tightness/pressure, palpitations, leg swelling, sudden loss of speech or loss of consciousness. - She  reports no regular exercise routine. - Her diet is high in salt, high in fat, and high in carbohydrates.   Depression screen [facility] 2/9 06/08/2020  Decreased Interest 0  Down, Depressed, Hopeless 1  PHQ - 2 Score 1  Altered sleeping 1  Tired, decreased energy 1  Change in appetite 0  Feeling bad or failure about yourself  0  Trouble concentrating 0  Moving slowly or fidgety/restless 0  Suicidal thoughts 0  PHQ-9 Score 3  Difficult doing work/chores Not difficult at all    Social History   Tobacco Use  . Smoking status: Former Research scientist (life sciences)  . Smokeless tobacco: Current User    Types: Snuff  Vaping Use  . Vaping Use: Never used  Substance Use Topics  . Alcohol use: Not Currently  . Drug use: Never    Review of Systems  Constitutional: Negative.   HENT: Negative.   Eyes: Negative.   Respiratory: Negative.   Cardiovascular: Negative.   Gastrointestinal: Negative.   Endocrine: Negative.   Genitourinary: Negative.   Musculoskeletal: Negative.   Skin: Negative.   Allergic/Immunologic: Negative.   Neurological: Negative.   Hematological: Negative.   Psychiatric/Behavioral: Negative.    Per HPI unless specifically indicated above     Objective:    BP (!) 170/73 (BP Location: Left Arm, Patient Position: Sitting, Cuff Size: Large)   Pulse 78   Temp 98.8 F (37.1 C) (Temporal)   Ht 5'  2.5" (1.588 m)   Wt 216 lb 6.4 oz (98.2 kg)   SpO2 99%   BMI 38.95 kg/m   Wt Readings from Last 3 Encounters:  09/25/20 216 lb 6.4 oz (98.2 kg)  06/08/20 200 lb 6.4 oz (90.9 kg)  03/06/20 199 lb 9.6 oz (90.5 kg)    Physical Exam Vitals and nursing note reviewed.  Constitutional:      General: She is not in acute distress.    Appearance: Normal appearance. She is well-developed and well-groomed. She is obese. She is not ill-appearing or toxic-appearing.  HENT:     Head: Normocephalic and atraumatic.     Nose:     Comments: Lizbeth Bark is in place, covering mouth and nose. Eyes:     General: Lids are normal. Vision grossly intact.        Right eye: No discharge.        Left eye: No discharge.     Extraocular Movements: Extraocular movements intact.     Conjunctiva/sclera: Conjunctivae normal.     Pupils: Pupils are equal, round, and reactive to light.  Cardiovascular:     Rate and Rhythm: Normal rate and regular rhythm.     Pulses: Normal pulses.     Heart sounds: Normal heart sounds. No murmur heard. No friction rub. No gallop.   Pulmonary:     Effort: Pulmonary effort is normal. No respiratory distress.  Breath sounds: Normal breath sounds.  Skin:    General: Skin is warm and dry.     Capillary Refill: Capillary refill takes less than 2 seconds.  Neurological:     General: No focal deficit present.     Mental Status: She is alert and oriented to person, place, and time.  Psychiatric:        Attention and Perception: Attention and perception normal.        Mood and Affect: Mood and affect normal.        Speech: Speech normal.        Behavior: Behavior normal. Behavior is cooperative.        Thought Content: Thought content normal.        Cognition and Memory: Cognition and memory normal.        Judgment: Judgment normal.    Results for orders placed or performed in visit on 06/08/20  POCT glycosylated hemoglobin (Hb A1C)  Result Value Ref Range   Hemoglobin A1C 5.5  4.0 - 5.6 %   HbA1c POC (<> result, manual entry)     HbA1c, POC (prediabetic range)     HbA1c, POC (controlled diabetic range)    POCT Urinalysis Dipstick  Result Value Ref Range   Color, UA Yellow    Clarity, UA clear    Glucose, UA Negative Negative   Bilirubin, UA negative    Ketones, UA negative    Spec Grav, UA 1.020 1.010 - 1.025   Blood, UA negative    pH, UA 5.0 5.0 - 8.0   Protein, UA Negative Negative   Urobilinogen, UA 0.2 0.2 or 1.0 E.U./dL   Nitrite, UA negative    Leukocytes, UA Negative Negative   Appearance     Odor        Assessment & Plan:   Problem List Items Addressed This Visit      Cardiovascular and Mediastinum   Hypertension    Uncontrolled hypertension.  BP is not at goal < 130/80.  Pt is not working on lifestyle modifications.  Has not been taking her medications for a few days, reports having run out of medications.   Plan: 1. Restart on chlorthalidone 25mg  daily and lisinopril 20mg  daily 2. Obtain labs at next visit  3. Encouraged heart healthy diet and increasing exercise to 30 minutes most days of the week, going no more than 2 days in a row without exercise. 4. Check BP 1-2 x per week at home, keep log, and bring to clinic at next appointment. 5. Follow up 3 months.       Relevant Medications   chlorthalidone (HYGROTON) 25 MG tablet   lisinopril (ZESTRIL) 20 MG tablet   rosuvastatin (CRESTOR) 10 MG tablet     Other   Hyperlipidemia    Refills on rosuvastatin sent to pharmacy.  To continue and repeat labs in 3 months.      Relevant Medications   chlorthalidone (HYGROTON) 25 MG tablet   lisinopril (ZESTRIL) 20 MG tablet   rosuvastatin (CRESTOR) 10 MG tablet   Anxiety    Stable and well controlled with hydroxyzine 10mg  TID PRN.  Will continue.      Relevant Medications   hydrOXYzine (ATARAX/VISTARIL) 10 MG tablet      Meds ordered this encounter  Medications  . chlorthalidone (HYGROTON) 25 MG tablet    Sig: Take 1 tablet (25  mg total) by mouth daily.    Dispense:  90 tablet    Refill:  0  .  lisinopril (ZESTRIL) 20 MG tablet    Sig: Take 1 tablet (20 mg total) by mouth daily.    Dispense:  90 tablet    Refill:  0  . rosuvastatin (CRESTOR) 10 MG tablet    Sig: Take 1 tablet (10 mg total) by mouth daily.    Dispense:  90 tablet    Refill:  1  . hydrOXYzine (ATARAX/VISTARIL) 10 MG tablet    Sig: Take 1 tablet (10 mg total) by mouth 3 (three) times daily as needed.    Dispense:  90 tablet    Refill:  1   Follow up plan: Return in about 6 months (around 03/25/2021) for HTN & Anxiety f/u.   Harlin Rain, [place] [facility] Nurse Practitioner [facility] 09/25/2020, 3:29 PM

## 2020-09-25 NOTE — Assessment & Plan Note (Signed)
Uncontrolled hypertension.  BP is not at goal < 130/80.  Pt is not working on lifestyle modifications.  Has not been taking her medications for a few days, reports having run out of medications.   Plan: 1. Restart on chlorthalidone 25mg  daily and lisinopril 20mg  daily 2. Obtain labs at next visit  3. Encouraged heart healthy diet and increasing exercise to 30 minutes most days of the week, going no more than 2 days in a row without exercise. 4. Check BP 1-2 x per week at home, keep log, and bring to clinic at next appointment. 5. Follow up 3 months.

## 2020-09-25 NOTE — Patient Instructions (Signed)
I have sent in refills on all of your medications so you can get restarted on your blood pressure medications.  Try to get exercise a minimum of 30 minutes per day at least 5 days per week as well as  adequate water intake all while measuring blood pressure a few times per week.  Keep a blood pressure log and bring back to clinic at your next visit.  If your readings are consistently over 130/80 to contact our office/send me a MyChart message and we will see you sooner.  Can try DASH and Mediterranean diet options, avoiding processed foods, lowering sodium intake, avoiding pork products, and eating a plant based diet for optimal health.  We will plan to see you back in 6 months for hypertension and anxiety follow up visit  You will receive a survey after today's visit either digitally by e-mail or paper by [place] mail. Your experiences and feedback matter to [place].  Please respond so we know how we are doing as we provide care for you.  Call us with any questions/concerns/needs.  It is my goal to be available to you for your health concerns.  Thanks for choosing me to be a partner in your healthcare needs!  Harlin Rain, FNP-C Family Nurse Practitioner [facility] Phone: [phone]

## 2020-09-25 NOTE — Assessment & Plan Note (Signed)
Stable and well controlled with hydroxyzine 10mg  TID PRN.  Will continue.

## 2021-03-21 ENCOUNTER — Ambulatory Visit (INDEPENDENT_AMBULATORY_CARE_PROVIDER_SITE_OTHER): Payer: Medicare HMO | Admitting: Internal Medicine

## 2021-03-21 ENCOUNTER — Encounter: Payer: Self-pay | Admitting: Internal Medicine

## 2021-03-21 ENCOUNTER — Other Ambulatory Visit: Payer: Self-pay

## 2021-03-21 VITALS — BP 138/76 | HR 91 | Temp 97.3°F | Resp 17 | Ht 62.5 in | Wt 213.8 lb

## 2021-03-21 DIAGNOSIS — E0590 Thyrotoxicosis, unspecified without thyrotoxic crisis or storm: Secondary | ICD-10-CM

## 2021-03-21 DIAGNOSIS — E6609 Other obesity due to excess calories: Secondary | ICD-10-CM | POA: Insufficient documentation

## 2021-03-21 DIAGNOSIS — D332 Benign neoplasm of brain, unspecified: Secondary | ICD-10-CM

## 2021-03-21 DIAGNOSIS — N1831 Chronic kidney disease, stage 3a: Secondary | ICD-10-CM | POA: Diagnosis not present

## 2021-03-21 DIAGNOSIS — N183 Chronic kidney disease, stage 3 unspecified: Secondary | ICD-10-CM | POA: Insufficient documentation

## 2021-03-21 DIAGNOSIS — E785 Hyperlipidemia, unspecified: Secondary | ICD-10-CM | POA: Diagnosis not present

## 2021-03-21 DIAGNOSIS — E66812 Obesity, class 2: Secondary | ICD-10-CM

## 2021-03-21 DIAGNOSIS — Z6835 Body mass index (BMI) 35.0-35.9, adult: Secondary | ICD-10-CM | POA: Insufficient documentation

## 2021-03-21 DIAGNOSIS — F32A Depression, unspecified: Secondary | ICD-10-CM

## 2021-03-21 DIAGNOSIS — D631 Anemia in chronic kidney disease: Secondary | ICD-10-CM | POA: Diagnosis not present

## 2021-03-21 DIAGNOSIS — F419 Anxiety disorder, unspecified: Secondary | ICD-10-CM

## 2021-03-21 DIAGNOSIS — I25119 Atherosclerotic heart disease of native coronary artery with unspecified angina pectoris: Secondary | ICD-10-CM

## 2021-03-21 DIAGNOSIS — E782 Mixed hyperlipidemia: Secondary | ICD-10-CM | POA: Diagnosis not present

## 2021-03-21 DIAGNOSIS — I10 Essential (primary) hypertension: Secondary | ICD-10-CM

## 2021-03-21 DIAGNOSIS — Z6838 Body mass index (BMI) 38.0-38.9, adult: Secondary | ICD-10-CM

## 2021-03-21 HISTORY — DX: Chronic kidney disease, stage 3 unspecified: N18.30

## 2021-03-21 HISTORY — DX: Other obesity due to excess calories: E66.09

## 2021-03-21 MED ORDER — ROSUVASTATIN CALCIUM 10 MG PO TABS: 10.0000 mg | ORAL_TABLET | Freq: Every day | ORAL | 1 refills | Status: DC

## 2021-03-21 MED ORDER — CHLORTHALIDONE 25 MG PO TABS: 25.0000 mg | ORAL_TABLET | Freq: Every day | ORAL | 1 refills | Status: DC

## 2021-03-21 MED ORDER — LISINOPRIL 20 MG PO TABS: 20.0000 mg | ORAL_TABLET | Freq: Every day | ORAL | 1 refills | Status: DC

## 2021-03-21 NOTE — Progress Notes (Signed)
Subjective:    Patient ID: Leslie Randall, female    DOB: 1951-06-29, 70 y.o.   MRN: [id number]  HPI  Patient presents to the clinic today for follow-up of chronic conditions.  She is establishing care with me today, transferring care from Lonie Peak, NP.  HTN: Her BP today is 147/71.  She is taking Chlorthalidone (been out x 1 week) and Lisinopril as prescribed.  There is no ECG on file.  HLD with CAD: Her last LDL was 135, triglycerides 158, 02/2020.  She denies myalgias on Rosuvastatin.  She tries to consume a low-fat diet.  Hyperthyroidism: She is not currently taking any medications for this at this time.  She does not follow with endocrinology.  Anxiety and Depression: Currently not an issue. She is not taking medications for this. She is not currently seeing a therapist.  She denies SI/HI.  CKD 3: Her last creatinine was 1.10, GFR 51, 03/2020.  She is on Lisinopril for renal protection.  She does not follow with nephrology.  Anemia: Her last H/H was 13.5/39.3, 02/2020.  She is not currently taking an oral iron supplement.  She does not follow with hematology.  Brain Tumor: She has not had surgery, chemo or radiation.  Review of Systems     Past Medical History:  Diagnosis Date   Anemia    Brain tumor (benign) (HCC)    CAD (coronary artery disease)    Depression    Hyperlipidemia    Hypertension    Menopause    Osteoarthritis of both knees    Vitamin D deficiency     Current Outpatient Medications  Medication Sig Dispense Refill   Biotin 1 MG CAPS Take by mouth.     calcium carbonate (OS-CAL) 1250 (500 Ca) MG chewable tablet Chew 1 tablet by mouth daily.     chlorthalidone (HYGROTON) 25 MG tablet Take 1 tablet (25 mg total) by mouth daily. 90 tablet 0   diclofenac Sodium (VOLTAREN) 1 % GEL Apply 2 g topically 4 (four) times daily. 50 g 1   fexofenadine (ALLEGRA ALLERGY) 180 MG tablet Take 1 tablet (180 mg total) by mouth daily. 90 tablet 0   hydrOXYzine  (ATARAX/VISTARIL) 10 MG tablet Take 1 tablet (10 mg total) by mouth 3 (three) times daily as needed. 90 tablet 1   lisinopril (ZESTRIL) 20 MG tablet Take 1 tablet (20 mg total) by mouth daily. 90 tablet 0   rosuvastatin (CRESTOR) 10 MG tablet Take 1 tablet (10 mg total) by mouth daily. 90 tablet 1   vitamin B-12 (CYANOCOBALAMIN) 500 MCG tablet Take 500 mcg by mouth daily. (Patient not taking: Reported on 09/25/2020)     Vitamin D, Ergocalciferol, 50 MCG (2000 UT) CAPS Take by mouth. (Patient not taking: Reported on 09/25/2020)     vitamin E 1000 UNIT capsule Take 1,000 Units by mouth daily. (Patient not taking: Reported on 09/25/2020)     No current facility-administered medications for this visit.    No Known Allergies  Family History  Problem Relation Age of Onset   Heart disease Mother    Stroke Father    Diabetes Father     Social History   Socioeconomic History   Marital status: Married    Spouse name: Not on file   Number of children: Not on file   Years of education: Not on file   Highest education level: Not on file  Occupational History   Not on file  Tobacco Use   Smoking  status: Former    Pack years: 0.00   Smokeless tobacco: Current    Types: Snuff  Vaping Use   Vaping Use: Never used  Substance and Sexual Activity   Alcohol use: Not Currently   Drug use: Never   Sexual activity: Not on file  Other Topics Concern   Not on file  Social History Narrative   Not on file   Social Determinants of Health   Financial Resource Strain: Not on file  Food Insecurity: Not on file  Transportation Needs: Not on file  Physical Activity: Not on file  Stress: Not on file  Social Connections: Not on file  Intimate Partner Violence: Not on file     Constitutional: Denies fever, malaise, fatigue, headache or abrupt weight changes.  HEENT: Denies eye pain, eye redness, ear pain, ringing in the ears, wax buildup, runny nose, nasal congestion, bloody nose, or sore  throat. Respiratory: Denies difficulty breathing, shortness of breath, cough or sputum production.   Cardiovascular: Denies chest pain, chest tightness, palpitations or swelling in the hands or feet.  Gastrointestinal: Denies abdominal pain, bloating, constipation, diarrhea or blood in the stool.  GU: Denies urgency, frequency, pain with urination, burning sensation, blood in urine, odor or discharge. Musculoskeletal: Denies decrease in range of motion, difficulty with gait, muscle pain or joint pain and swelling.  Skin: Denies redness, rashes, lesions or ulcercations.  Neurological: Pt reports difficulty with memory. Denies dizziness, difficulty with speech or problems with balance and coordination.  Psych: Patient has a history of anxiety and depression.  Denies SI/HI.  No other specific complaints in a complete review of systems (except as listed in HPI above).  Objective:   Physical Exam   BP (!) 147/71 (BP Location: Right Arm, Patient Position: Sitting, Cuff Size: Large)   Pulse 91   Temp (!) 97.3 F (36.3 C) (Temporal)   Resp 17   Ht 5' 2.5" (1.588 m)   Wt 213 lb 12.8 oz (97 kg)   SpO2 100%   BMI 38.48 kg/m   Wt Readings from Last 3 Encounters:  09/25/20 216 lb 6.4 oz (98.2 kg)  06/08/20 200 lb 6.4 oz (90.9 kg)  03/06/20 199 lb 9.6 oz (90.5 kg)    General: Appears her stated age, obese, in NAD. Skin: Warm, dry and intact.  HEENT: Head: normal shape and size; Eyes: sclera white and EOMs intact;  Neck:  Neck supple, trachea midline. No masses, lumps or thyromegaly present.  Cardiovascular: Normal rate and rhythm. S1,S2 noted.  No murmur, rubs or gallops noted. No JVD or BLE edema. No carotid bruits noted. Pulmonary/Chest: Normal effort and positive vesicular breath sounds. No respiratory distress. No wheezes, rales or ronchi noted.  Musculoskeletal: No difficulty with gait.  Neurological: Alert and oriented. Psychiatric: Mood and affect normal. Behavior is normal.  Judgment and thought content normal.    BMET    Component Value Date/Time   NA 140 04/07/2020 1521   K 3.2 (L) 04/07/2020 1521   CL 103 04/07/2020 1521   CO2 25 04/07/2020 1521   GLUCOSE 122 (H) 04/07/2020 1521   BUN 16 04/07/2020 1521   CREATININE 1.10 (H) 04/07/2020 1521   CREATININE 0.95 (H) 03/08/2020 0854   CALCIUM 10.0 04/07/2020 1521   GFRNONAA 51 (L) 04/07/2020 1521   GFRNONAA 61 03/08/2020 0854   GFRAA 59 (L) 04/07/2020 1521   GFRAA 70 03/08/2020 0854    Lipid Panel     Component Value Date/Time   CHOL 217 (H)  03/08/2020 0854   TRIG 158 (H) 03/08/2020 0854   HDL 53 03/08/2020 0854   CHOLHDL 4.1 03/08/2020 0854   LDLCALC 135 (H) 03/08/2020 0854    CBC    Component Value Date/Time   WBC 5.9 03/08/2020 0854   RBC 4.40 03/08/2020 0854   HGB 13.5 03/08/2020 0854   HCT 39.3 03/08/2020 0854   PLT 274 03/08/2020 0854   MCV 89.3 03/08/2020 0854   MCH 30.7 03/08/2020 0854   MCHC 34.4 03/08/2020 0854   RDW 12.9 03/08/2020 0854   LYMPHSABS 1,977 03/08/2020 0854   MONOABS 0.6 12/29/2006 1013   EOSABS 100 03/08/2020 0854   BASOSABS 30 03/08/2020 0854    Hgb A1C Lab Results  Component Value Date   HGBA1C 5.5 06/08/2020           Assessment & Plan:     Webb Silversmith, NP This visit occurred during the SARS-CoV-2 public health emergency.  Safety protocols were in place, including screening questions prior to the visit, additional usage of staff PPE, and extensive cleaning of exam room while observing appropriate contact time as indicated for disinfecting solutions.

## 2021-03-21 NOTE — Assessment & Plan Note (Signed)
CMET and lipid profile today Encouraged her to consume a low fat diet Rosuvastatin refilled today

## 2021-03-21 NOTE — Assessment & Plan Note (Signed)
Encouraged diet and exercise for weight loss ?

## 2021-03-21 NOTE — Assessment & Plan Note (Signed)
CBC today.  

## 2021-03-21 NOTE — Assessment & Plan Note (Signed)
No intervention Will monitor

## 2021-03-21 NOTE — Assessment & Plan Note (Signed)
Elevated today Manual repeat Continue Chlorthalidone and Lisinopril, refilled today Reinforced DASH diet and exercise for weight loss

## 2021-03-21 NOTE — Patient Instructions (Signed)
Heart-Healthy Eating Plan Heart-healthy meal planning includes: Eating less unhealthy fats. Eating more healthy fats. Making other changes in your diet. Talk with your doctor or a diet specialist (dietitian) to create an eating plan that is right for you. What is my plan? Your doctor may recommend an eating plan that includes: Total fat: ______% or less of total calories a day. Saturated fat: ______% or less of total calories a day. Cholesterol: less than _________mg a day. What are tips for following this plan? Cooking Avoid frying your food. Try to bake, boil, grill, or broil it instead. You can also reduce fat by: Removing the skin from poultry. Removing all visible fats from meats. Steaming vegetables in water or broth. Meal planning  At meals, divide your plate into four equal parts: Fill one-half of your plate with vegetables and green salads. Fill one-fourth of your plate with whole grains. Fill one-fourth of your plate with lean protein foods. Eat 4-5 servings of vegetables per day. A serving of vegetables is: 1 cup of raw or cooked vegetables. 2 cups of raw leafy greens. Eat 4-5 servings of fruit per day. A serving of fruit is: 1 medium whole fruit.  cup of dried fruit.  cup of fresh, frozen, or canned fruit.  cup of 100% fruit juice. Eat more foods that have soluble fiber. These are apples, broccoli, carrots, beans, peas, and barley. Try to get 20-30 g of fiber per day. Eat 4-5 servings of nuts, legumes, and seeds per week: 1 serving of dried beans or legumes equals  cup after being cooked. 1 serving of nuts is  cup. 1 serving of seeds equals 1 tablespoon.  General information Eat more home-cooked food. Eat less restaurant, buffet, and fast food. Limit or avoid alcohol. Limit foods that are high in starch and sugar. Avoid fried foods. Lose weight if you are overweight. Keep track of how much salt (sodium) you eat. This is important if you have high blood  pressure. Ask your doctor to tell you more about this. Try to add vegetarian meals each week. Fats Choose healthy fats. These include olive oil and canola oil, flaxseeds, walnuts, almonds, and seeds. Eat more omega-3 fats. These include salmon, mackerel, sardines, tuna, flaxseed oil, and ground flaxseeds. Try to eat fish at least 2 times each week. Check food labels. Avoid foods with trans fats or high amounts of saturated fat. Limit saturated fats. These are often found in animal products, such as meats, butter, and cream. These are also found in plant foods, such as palm oil, palm kernel oil, and coconut oil. Avoid foods with partially hydrogenated oils in them. These have trans fats. Examples are stick margarine, some tub margarines, cookies, crackers, and other baked goods. What foods can I eat? Fruits All fresh, canned (in natural juice), or frozen fruits. Vegetables Fresh or frozen vegetables (raw, steamed, roasted, or grilled). Green salads. Grains Most grains. Choose whole wheat and whole grains most of the time. Rice andpasta, including brown rice and pastas made with whole wheat. Meats and other proteins Lean, well-trimmed beef, veal, pork, and lamb. Chicken and turkey without skin. All fish and shellfish. Wild duck, rabbit, pheasant, and venison. Egg whites or low-cholesterol egg substitutes. Dried beans, peas, lentils, and tofu. Seedsand most nuts. Dairy Low-fat or nonfat cheeses, including ricotta and mozzarella. Skim or 1% milk that is liquid, powdered, or evaporated. Buttermilk that is made with low-fatmilk. Nonfat or low-fat yogurt. Fats and oils Non-hydrogenated (trans-free) margarines. Vegetable oils, including soybean, sesame,   sunflower, olive, peanut, safflower, corn, canola, and cottonseed. Salad dressings or mayonnaisemade with a vegetable oil. Beverages Mineral water. Coffee and tea. Diet carbonated beverages. Sweets and desserts Sherbet, gelatin, and fruit ice. Small  amounts of dark chocolate. Limit all sweets and desserts. Seasonings and condiments All seasonings and condiments. The items listed above may not be a complete list of foods and drinks you can eat. Contact a dietitian for more options. What foods should I avoid? Fruits Canned fruit in heavy syrup. Fruit in cream or butter sauce. Fried fruit. Limitcoconut. Vegetables Vegetables cooked in cheese, cream, or butter sauce. Fried vegetables. Grains Breads that are made with saturated or trans fats, oils, or whole milk. Croissants. Sweet rolls. Donuts. High-fat crackers,such as cheese crackers. Meats and other proteins Fatty meats, such as hot dogs, ribs, sausage, bacon, rib-eye roast or steak. High-fat deli meats, such as salami and bologna. Caviar. Domestic duck andgoose. Organ meats, such as liver. Dairy Cream, sour cream, cream cheese, and creamed cottage cheese. Whole-milk cheeses. Whole or 2% milk that is liquid, evaporated, or condensed. Whole buttermilk. Cream sauce or high-fat cheese sauce. Yogurt that is made fromwhole milk. Fats and oils Meat fat, or shortening. Cocoa butter, hydrogenated oils, palm oil, coconut oil, palm kernel oil. Solid fats and shortenings, including bacon fat, salt pork, lard, and butter. Nondairy cream substitutes. Salad dressings with cheeseor sour cream. Beverages Regular sodas and juice drinks with added sugar. Sweets and desserts Frosting. Pudding. Cookies. Cakes. Pies. Milk chocolate or white chocolate.Buttered syrups. Full-fat ice cream or ice cream drinks. The items listed above may not be a complete list of foods and drinks to avoid. Contact a dietitian for more information. Summary Heart-healthy meal planning includes eating less unhealthy fats, eating more healthy fats, and making other changes in your diet. Eat a balanced diet. This includes fruits and vegetables, low-fat or nonfat dairy, lean protein, nuts and legumes, whole grains, and heart-healthy  oils and fats. This information is not intended to replace advice given to you by your health care provider. Make sure you discuss any questions you have with your healthcare provider. Document Revised: 11/06/2017 Document Reviewed: 10/10/2017 Elsevier Patient Education  2022 [employer] Inc.  

## 2021-03-21 NOTE — Assessment & Plan Note (Signed)
TSH today 

## 2021-03-21 NOTE — Assessment & Plan Note (Signed)
Stable off meds °Will monitor °

## 2021-03-21 NOTE — Assessment & Plan Note (Signed)
CMET today Continue Lisinopril for renal protection

## 2021-03-22 LAB — CBC
HCT: 40.5 % (ref 35.0–45.0)
Hemoglobin: 13.2 g/dL (ref 11.7–15.5)
MCH: 29.6 pg (ref 27.0–33.0)
MCHC: 32.6 g/dL (ref 32.0–36.0)
MCV: 90.8 fL (ref 80.0–100.0)
MPV: 10.3 fL (ref 7.5–12.5)
Platelets: 251 10*3/uL (ref 140–400)
RBC: 4.46 10*6/uL (ref 3.80–5.10)
RDW: 12.7 % (ref 11.0–15.0)
WBC: 6.7 10*3/uL (ref 3.8–10.8)

## 2021-03-22 LAB — COMPLETE METABOLIC PANEL WITH GFR
AG Ratio: 2.1 (calc) (ref 1.0–2.5)
ALT: 12 U/L (ref 6–29)
AST: 19 U/L (ref 10–35)
Albumin: 4.5 g/dL (ref 3.6–5.1)
Alkaline phosphatase (APISO): 55 U/L (ref 37–153)
BUN/Creatinine Ratio: 21 (calc) (ref 6–22)
BUN: 22 mg/dL (ref 7–25)
CO2: 26 mmol/L (ref 20–32)
Calcium: 10.2 mg/dL (ref 8.6–10.4)
Chloride: 104 mmol/L (ref 98–110)
Creat: 1.05 mg/dL — ABNORMAL HIGH (ref 0.60–0.93)
GFR, Est African American: 62 mL/min/{1.73_m2} (ref >=60)
GFR, Est Non African American: 53 mL/min/{1.73_m2} — ABNORMAL LOW (ref >=60)
Globulin: 2.1 g/dL (calc) (ref 1.9–3.7)
Glucose, Bld: 112 mg/dL — ABNORMAL HIGH (ref 65–99)
Potassium: 3.3 mmol/L — ABNORMAL LOW (ref 3.5–5.3)
Sodium: 142 mmol/L (ref 135–146)
Total Bilirubin: 1.1 mg/dL (ref 0.2–1.2)
Total Protein: 6.6 g/dL (ref 6.1–8.1)

## 2021-03-22 LAB — HEMOGLOBIN A1C
Hgb A1c MFr Bld: 5.5 % of total Hgb (ref <5.7)
Mean Plasma Glucose: 111 mg/dL
eAG (mmol/L): 6.2 mmol/L

## 2021-03-22 LAB — LIPID PANEL
Cholesterol: 120 mg/dL (ref <200)
HDL: 55 mg/dL (ref >=50)
LDL Cholesterol (Calc): 45 mg/dL (calc)
Non-HDL Cholesterol (Calc): 65 mg/dL (calc) (ref <130)
Total CHOL/HDL Ratio: 2.2 (calc) (ref <5.0)
Triglycerides: 121 mg/dL (ref <150)

## 2021-03-22 LAB — TSH: TSH: 0.88 mIU/L (ref 0.40–4.50)

## 2021-09-24 ENCOUNTER — Encounter: Payer: Medicare HMO | Admitting: Internal Medicine

## 2021-09-24 NOTE — Progress Notes (Deleted)
Subjective:    Patient ID: Leslie Randall, female    DOB: [birth year], 71 y.o.   MRN: [id number]  HPI  Pt presents to the clinic today for her annual exam.  Flu: Tetanus: Covid: Jannsen x 1 Pneumovax: Prevnar: Shingrix: Pap smear: no longer screening Mammogram: Bone density: Colon screening: Vision screening: Dentist:  Diet: Exercise:  Review of Systems     Past Medical History:  Diagnosis Date   Anemia    Brain tumor (benign) ([place])    CAD (coronary artery disease)    Depression    Hyperlipidemia    Hypertension    Menopause    Osteoarthritis of both knees    Vitamin D deficiency     Current Outpatient Medications  Medication Sig Dispense Refill   Biotin 1 MG CAPS Take by mouth.     chlorthalidone (HYGROTON) 25 MG tablet Take 1 tablet (25 mg total) by mouth daily. 90 tablet 1   diclofenac Sodium (VOLTAREN) 1 % GEL Apply 2 g topically 4 (four) times daily. 50 g 1   fexofenadine (ALLEGRA ALLERGY) 180 MG tablet Take 1 tablet (180 mg total) by mouth daily. 90 tablet 0   ibuprofen (ADVIL) 200 MG tablet Take 400 mg by mouth 2 (two) times daily.     lisinopril (ZESTRIL) 20 MG tablet Take 1 tablet (20 mg total) by mouth daily. 90 tablet 1   rosuvastatin (CRESTOR) 10 MG tablet Take 1 tablet (10 mg total) by mouth daily. 90 tablet 1   vitamin B-12 (CYANOCOBALAMIN) 500 MCG tablet Take 500 mcg by mouth daily.     No current facility-administered medications for this visit.    No Known Allergies  Family History  Problem Relation Age of Onset   Heart disease Mother    Stroke Father    Diabetes Father     Social History   Socioeconomic History   Marital status: Married    Spouse name: Not on file   Number of children: Not on file   Years of education: Not on file   Highest education level: Not on file  Occupational History   Not on file  Tobacco Use   Smoking status: Former   Smokeless tobacco: Current    Types: Snuff  Vaping Use   Vaping Use: Never used   Substance and Sexual Activity   Alcohol use: Not Currently   Drug use: Never   Sexual activity: Not on file  Other Topics Concern   Not on file  Social History Narrative   Not on file   Social Determinants of Health   Financial Resource Strain: Not on file  Food Insecurity: Not on file  Transportation Needs: Not on file  Physical Activity: Not on file  Stress: Not on file  Social Connections: Not on file  Intimate Partner Violence: Not on file     Constitutional: Denies fever, malaise, fatigue, headache or abrupt weight changes.  HEENT: Denies eye pain, eye redness, ear pain, ringing in the ears, wax buildup, runny nose, nasal congestion, bloody nose, or sore throat. Respiratory: Denies difficulty breathing, shortness of breath, cough or sputum production.   Cardiovascular: Denies chest pain, chest tightness, palpitations or swelling in the hands or feet.  Gastrointestinal: Denies abdominal pain, bloating, constipation, diarrhea or blood in the stool.  GU: Denies urgency, frequency, pain with urination, burning sensation, blood in urine, odor or discharge. Musculoskeletal: Denies decrease in range of motion, difficulty with gait, muscle pain or joint pain and swelling.  Skin:  Denies redness, rashes, lesions or ulcercations.  Neurological: Denies dizziness, difficulty with memory, difficulty with speech or problems with balance and coordination.  Psych: Pt has a history of anxiety and depression. Denies SI/HI.  No other specific complaints in a complete review of systems (except as listed in HPI above).  Objective:   Physical Exam   There were no vitals taken for this visit. Wt Readings from Last 3 Encounters:  03/21/21 213 lb 12.8 oz (97 kg)  09/25/20 216 lb 6.4 oz (98.2 kg)  06/08/20 200 lb 6.4 oz (90.9 kg)    General: Appears their stated age, well developed, well nourished in NAD. Skin: Warm, dry and intact. No rashes, lesions or ulcerations noted. HEENT: Head:  normal shape and size; Eyes: sclera white, no icterus, conjunctiva pink, PERRLA and EOMs intact; Ears: Tm's gray and intact, normal light reflex; Nose: mucosa pink and moist, septum midline; Throat/Mouth: Teeth present, mucosa pink and moist, no exudate, lesions or ulcerations noted.  Neck:  Neck supple, trachea midline. No masses, lumps or thyromegaly present.  Cardiovascular: Normal rate and rhythm. S1,S2 noted.  No murmur, rubs or gallops noted. No JVD or BLE edema. No carotid bruits noted. Pulmonary/Chest: Normal effort and positive vesicular breath sounds. No respiratory distress. No wheezes, rales or ronchi noted.  Abdomen: Soft and nontender. Normal bowel sounds. No distention or masses noted. Liver, spleen and kidneys non palpable. Musculoskeletal: Normal range of motion. No signs of joint swelling. No difficulty with gait.  Neurological: Alert and oriented. Cranial nerves II-XII grossly intact. Coordination normal.  Psychiatric: Mood and affect normal. Behavior is normal. Judgment and thought content normal.     BMET    Component Value Date/Time   NA 142 03/21/2021 1054   K 3.3 (L) 03/21/2021 1054   CL 104 03/21/2021 1054   CO2 26 03/21/2021 1054   GLUCOSE 112 (H) 03/21/2021 1054   BUN 22 03/21/2021 1054   CREATININE 1.05 (H) 03/21/2021 1054   CALCIUM 10.2 03/21/2021 1054   GFRNONAA 53 (L) 03/21/2021 1054   GFRAA 62 03/21/2021 1054    Lipid Panel     Component Value Date/Time   CHOL 120 03/21/2021 1054   TRIG 121 03/21/2021 1054   HDL 55 03/21/2021 1054   CHOLHDL 2.2 03/21/2021 1054   LDLCALC 45 03/21/2021 1054    CBC    Component Value Date/Time   WBC 6.7 03/21/2021 1054   RBC 4.46 03/21/2021 1054   HGB 13.2 03/21/2021 1054   HCT 40.5 03/21/2021 1054   PLT 251 03/21/2021 1054   MCV 90.8 03/21/2021 1054   MCH 29.6 03/21/2021 1054   MCHC 32.6 03/21/2021 1054   RDW 12.7 03/21/2021 1054   LYMPHSABS 1,977 03/08/2020 0854   MONOABS 0.6 12/29/2006 1013   EOSABS  100 03/08/2020 0854   BASOSABS 30 03/08/2020 0854    Hgb A1C Lab Results  Component Value Date   HGBA1C 5.5 03/21/2021           Assessment & Plan:   Preventative Health Maintenance:  Flu Tetanus Encouraged her to get her covid booster Pneumovax Prevnar Shingrix She no longer wants to screen for cervical cancer Encouraged her to consume a balanced diet and exercise regimen Advised her to see an eye doctor and dentist annually Will check CBC, CMET, Lipid, A1C and Hep C today  RTC in 6 months, follow up chronic conditions Webb Silversmith, NP This visit occurred during the SARS-CoV-2 public health emergency.  Safety protocols were in place,  including screening questions prior to the visit, additional usage of staff PPE, and extensive cleaning of exam room while observing appropriate contact time as indicated for disinfecting solutions.

## 2021-09-28 ENCOUNTER — Telehealth: Payer: Self-pay

## 2021-09-28 ENCOUNTER — Other Ambulatory Visit: Payer: Self-pay | Admitting: Internal Medicine

## 2021-09-28 DIAGNOSIS — E785 Hyperlipidemia, unspecified: Secondary | ICD-10-CM

## 2021-09-28 DIAGNOSIS — I10 Essential (primary) hypertension: Secondary | ICD-10-CM

## 2021-09-28 MED ORDER — ROSUVASTATIN CALCIUM 10 MG PO TABS: 10.0000 mg | ORAL_TABLET | Freq: Every day | ORAL | 0 refills | Status: DC

## 2021-09-28 MED ORDER — LISINOPRIL 20 MG PO TABS: 20.0000 mg | ORAL_TABLET | Freq: Every day | ORAL | 0 refills | Status: DC

## 2021-09-28 MED ORDER — CHLORTHALIDONE 25 MG PO TABS: 25.0000 mg | ORAL_TABLET | Freq: Every day | ORAL | 0 refills | Status: DC

## 2021-09-28 NOTE — Telephone Encounter (Signed)
Medication: rosuvastatin (CRESTOR) 10 MG tablet [[id number]], lisinopril (ZESTRIL) 20 MG tablet [[id number]], chlorthalidone (HYGROTON) 25 MG tablet [[id number]]  Has the patient contacted their pharmacy? YES Advised to contact the office. Pt has been out of medication since Monday (Agent: If no, request that the patient contact the pharmacy for the refill. If patient does not wish to contact the pharmacy document the reason why and proceed with request.) (Agent: If yes, when and what did the pharmacy advise?)   Preferred Pharmacy (with phone number or street name): [facility] [place], [place] [address] [place] [address] Phone: [phone] Fax: [phone] Hours: Not open 24 hours  Has the patient been seen for an appointment in the last year OR does the patient have an upcoming appointment? YES 10/02/21  Agent: Please be advised that RX refills may take up to 3 business days. We ask that you follow-up with your pharmacy.

## 2021-09-28 NOTE — Telephone Encounter (Signed)
I have sent over Crestor and lisinopril to her pharmacy.  She has no nerve medication on her list.

## 2021-09-28 NOTE — Telephone Encounter (Signed)
Pt at the window reqeusting refills on BP medication Crestor,lisinopril,nerve medication  no name given.Marland KitchenMarland KitchenMarland KitchenMarland Kitchen [facility]

## 2021-09-28 NOTE — Telephone Encounter (Signed)
Requested Prescriptions  Pending Prescriptions Disp Refills   chlorthalidone (HYGROTON) 25 MG tablet 90 tablet 0    Sig: Take 1 tablet (25 mg total) by mouth daily.     Cardiovascular: Diuretics - Thiazide Failed - 09/28/2021 11:07 AM      Failed - Cr in normal range and within 360 days    Creat  Date Value Ref Range Status  03/21/2021 1.05 (H) 0.60 - 0.93 mg/dL Final    Comment:    For patients >73 years of age, the reference limit for Creatinine is approximately 13% higher for people identified as African-American. .          Failed - K in normal range and within 360 days    Potassium  Date Value Ref Range Status  03/21/2021 3.3 (L) 3.5 - 5.3 mmol/L Final         Failed - Valid encounter within last 6 months    Recent Outpatient Visits          6 months ago Anemia due to stage 3a chronic kidney disease ([place])   [facility] [place], [place] W, NP   1 year ago Hypertension, unspecified type   [place], FNP   1 year ago Annual physical exam   [facility], Lupita Raider, FNP   1 year ago Weight gain   [facility], Lupita Raider, [place]      Future Appointments            In 4 days Baity, Coralie Keens, NP [facility], [place] in normal range and within 360 days    Calcium  Date Value Ref Range Status  03/21/2021 10.2 8.6 - 10.4 mg/dL Final         Passed - Na in normal range and within 360 days    Sodium  Date Value Ref Range Status  03/21/2021 142 135 - 146 mmol/L Final         Passed - Last BP in normal range    BP Readings from Last 1 Encounters:  03/21/21 138/76          lisinopril (ZESTRIL) 20 MG tablet 90 tablet 0    Sig: Take 1 tablet (20 mg total) by mouth daily.     Cardiovascular:  ACE Inhibitors Failed - 09/28/2021 11:07 AM      Failed - Cr in normal range and within 180 days    Creat  Date Value Ref Range Status  03/21/2021 1.05  (H) 0.60 - 0.93 mg/dL Final    Comment:    For patients >84 years of age, the reference limit for Creatinine is approximately 13% higher for people identified as African-American. .          Failed - K in normal range and within 180 days    Potassium  Date Value Ref Range Status  03/21/2021 3.3 (L) 3.5 - 5.3 mmol/L Final         Failed - Valid encounter within last 6 months    Recent Outpatient Visits          6 months ago Anemia due to stage 3a chronic kidney disease [facility])   [facility] [place], Coralie Keens, NP   1 year ago Hypertension, unspecified type   [place], FNP   1 year ago  Annual physical exam   [facility], Lupita Raider, FNP   1 year ago Weight gain   [facility], Lupita Raider, [place]      Future Appointments            In 4 days [place], Coralie Keens, NP [facility], [place] - Patient is not pregnant      Passed - Last BP in normal range    BP Readings from Last 1 Encounters:  03/21/21 138/76          rosuvastatin (CRESTOR) 10 MG tablet 90 tablet 0    Sig: Take 1 tablet (10 mg total) by mouth daily.     Cardiovascular:  Antilipid - Statins Passed - 09/28/2021 11:07 AM      Passed - Total Cholesterol in normal range and within 360 days    Cholesterol  Date Value Ref Range Status  03/21/2021 120 <200 mg/dL Final         Passed - LDL in normal range and within 360 days    LDL Cholesterol (Calc)  Date Value Ref Range Status  03/21/2021 45 mg/dL (calc) Final    Comment:    Reference range: <100 . Desirable range <100 mg/dL for primary prevention;   <70 mg/dL for patients with CHD or diabetic patients  with > or = 2 CHD risk factors. Marland Kitchen LDL-C is now calculated using the Martin-Hopkins  calculation, which is a validated novel method providing  better accuracy than the Friedewald equation in the  estimation of LDL-C.  Cresenciano Genre et al. Annamaria Helling.  4098;119(14): 2061-2068  (http://education.[employer].com/faq/FAQ164)          Passed - HDL in normal range and within 360 days    HDL  Date Value Ref Range Status  03/21/2021 55 > OR = 50 mg/dL Final         Passed - Triglycerides in normal range and within 360 days    Triglycerides  Date Value Ref Range Status  03/21/2021 121 <150 mg/dL Final         Passed - Patient is not pregnant      Passed - Valid encounter within last 12 months    Recent Outpatient Visits          6 months ago Anemia due to stage 3a chronic kidney disease ([place])   [facility] [place], Coralie Keens, NP   1 year ago Hypertension, unspecified type   [place], FNP   1 year ago Annual physical exam   [facility], Lupita Raider, FNP   1 year ago Weight gain   [facility], Lupita Raider, FNP      Future Appointments            In 4 days Baity, Coralie Keens, NP [facility], [facility]

## 2021-09-28 NOTE — Addendum Note (Signed)
Addended by: Jearld Fenton on: 09/28/2021 02:37 PM   Modules accepted: Orders

## 2021-10-01 NOTE — Telephone Encounter (Signed)
I attempted to contact the patient and her phone number is not accurate.

## 2021-10-02 ENCOUNTER — Other Ambulatory Visit: Payer: Self-pay

## 2021-10-02 ENCOUNTER — Ambulatory Visit (INDEPENDENT_AMBULATORY_CARE_PROVIDER_SITE_OTHER): Payer: Medicare HMO | Admitting: Internal Medicine

## 2021-10-02 ENCOUNTER — Encounter: Payer: Self-pay | Admitting: Internal Medicine

## 2021-10-02 VITALS — BP 131/95 | HR 72 | Temp 97.5°F | Ht 62.5 in | Wt 199.0 lb

## 2021-10-02 DIAGNOSIS — Z23 Encounter for immunization: Secondary | ICD-10-CM | POA: Diagnosis not present

## 2021-10-02 DIAGNOSIS — Z6835 Body mass index (BMI) 35.0-35.9, adult: Secondary | ICD-10-CM | POA: Diagnosis not present

## 2021-10-02 DIAGNOSIS — Z1159 Encounter for screening for other viral diseases: Secondary | ICD-10-CM | POA: Diagnosis not present

## 2021-10-02 DIAGNOSIS — Z0001 Encounter for general adult medical examination with abnormal findings: Secondary | ICD-10-CM

## 2021-10-02 DIAGNOSIS — E785 Hyperlipidemia, unspecified: Secondary | ICD-10-CM | POA: Diagnosis not present

## 2021-10-02 NOTE — Progress Notes (Signed)
Subjective:    Patient ID: Leslie Randall, female    DOB: 30-Jun-1951, 71 y.o.   MRN: [id number]  HPI  Pt presents to the clinic today for her annual exam.  Flu: never Tetanus: unsure Covid: Janssen x 1 Pneumovax: never Prevnar: never Shingrix: never Pap smear: no longer screening, hysterectomy Mammogram: > 5 years ago Bone density: > 5 years ago Colon screening: > 10 years ago Vision screening: as needed Dentist: as needed  Diet: She does eat meat. She consumes fruits and veggies. She does eat some fried foods. She drinks mostly water. Exercise: Walking  Review of Systems     Past Medical History:  Diagnosis Date   Anemia    Brain tumor (benign) ([facility])    CAD (coronary artery disease)    Depression    Hyperlipidemia    Hypertension    Menopause    Osteoarthritis of both knees    Vitamin D deficiency     Current Outpatient Medications  Medication Sig Dispense Refill   Biotin 1 MG CAPS Take by mouth.     chlorthalidone (HYGROTON) 25 MG tablet Take 1 tablet (25 mg total) by mouth daily. 90 tablet 0   diclofenac Sodium (VOLTAREN) 1 % GEL Apply 2 g topically 4 (four) times daily. 50 g 1   fexofenadine (ALLEGRA ALLERGY) 180 MG tablet Take 1 tablet (180 mg total) by mouth daily. 90 tablet 0   ibuprofen (ADVIL) 200 MG tablet Take 400 mg by mouth 2 (two) times daily.     lisinopril (ZESTRIL) 20 MG tablet Take 1 tablet (20 mg total) by mouth daily. 90 tablet 0   rosuvastatin (CRESTOR) 10 MG tablet Take 1 tablet (10 mg total) by mouth daily. 90 tablet 0   vitamin B-12 (CYANOCOBALAMIN) 500 MCG tablet Take 500 mcg by mouth daily.     No current facility-administered medications for this visit.    No Known Allergies  Family History  Problem Relation Age of Onset   Heart disease Mother    Stroke Father    Diabetes Father     Social History   Socioeconomic History   Marital status: Married    Spouse name: Not on file   Number of children: Not on file   Years of  education: Not on file   Highest education level: Not on file  Occupational History   Not on file  Tobacco Use   Smoking status: Former   Smokeless tobacco: Current    Types: Snuff  Vaping Use   Vaping Use: Never used  Substance and Sexual Activity   Alcohol use: Not Currently   Drug use: Never   Sexual activity: Not on file  Other Topics Concern   Not on file  Social History Narrative   Not on file   Social Determinants of Health   Financial Resource Strain: Not on file  Food Insecurity: Not on file  Transportation Needs: Not on file  Physical Activity: Not on file  Stress: Not on file  Social Connections: Not on file  Intimate Partner Violence: Not on file     Constitutional: Denies fever, malaise, fatigue, headache or abrupt weight changes.  HEENT: Denies eye pain, eye redness, ear pain, ringing in the ears, wax buildup, runny nose, nasal congestion, bloody nose, or sore throat. Respiratory: Denies difficulty breathing, shortness of breath, cough or sputum production.   Cardiovascular: Denies chest pain, chest tightness, palpitations or swelling in the hands or feet.  Gastrointestinal: Patient reports hemorrhoids.  Denies abdominal pain, bloating, constipation, diarrhea or blood in the stool.  GU: Denies urgency, frequency, pain with urination, burning sensation, blood in urine, odor or discharge. Musculoskeletal: Patient reports fall 1 week ago, bilateral knee pain.  Denies decrease in range of motion, difficulty with gait, muscle pain or joint swelling.  Skin: Denies redness, rashes, lesions or ulcercations.  Neurological: Denies dizziness, difficulty with memory, difficulty with speech or problems with balance and coordination.  Psych: Patient has a history of anxiety and depression.  Denies SI/HI.  No other specific complaints in a complete review of systems (except as listed in HPI above).  Objective:   Physical Exam  BP (!) 131/95    Pulse 72    Temp (!) 97.5  F (36.4 C) (Oral)    Ht 5' 2.5" (1.588 m)    Wt 199 lb (90.3 kg)    SpO2 100%    BMI 35.82 kg/m   Wt Readings from Last 3 Encounters:  03/21/21 213 lb 12.8 oz (97 kg)  09/25/20 216 lb 6.4 oz (98.2 kg)  06/08/20 200 lb 6.4 oz (90.9 kg)    General: Appears her stated age, obese in NAD. Skin: Warm, dry and intact.  HEENT: Head: normal shape and size; Eyes: EOMs intact;  Neck:  Neck supple, trachea midline. No masses, lumps or thyromegaly present.  Cardiovascular: Normal rate and rhythm. S1,S2 noted.  No murmur, rubs or gallops noted. No JVD or BLE edema. No carotid bruits noted. Pulmonary/Chest: Normal effort and positive vesicular breath sounds. No respiratory distress. No wheezes, rales or ronchi noted.  Abdomen: Soft and nontender. Normal bowel sounds. Musculoskeletal: Strength 5/5 BUE/BLE.  No signs of joint swelling. No difficulty with gait.  Neurological: Alert and oriented. Cranial nerves II-XII grossly intact. Coordination normal.  Psychiatric: Mood and affect normal. Behavior is normal. Judgment and thought content normal.    BMET    Component Value Date/Time   NA 142 03/21/2021 1054   K 3.3 (L) 03/21/2021 1054   CL 104 03/21/2021 1054   CO2 26 03/21/2021 1054   GLUCOSE 112 (H) 03/21/2021 1054   BUN 22 03/21/2021 1054   CREATININE 1.05 (H) 03/21/2021 1054   CALCIUM 10.2 03/21/2021 1054   GFRNONAA 53 (L) 03/21/2021 1054   GFRAA 62 03/21/2021 1054    Lipid Panel     Component Value Date/Time   CHOL 120 03/21/2021 1054   TRIG 121 03/21/2021 1054   HDL 55 03/21/2021 1054   CHOLHDL 2.2 03/21/2021 1054   LDLCALC 45 03/21/2021 1054    CBC    Component Value Date/Time   WBC 6.7 03/21/2021 1054   RBC 4.46 03/21/2021 1054   HGB 13.2 03/21/2021 1054   HCT 40.5 03/21/2021 1054   PLT 251 03/21/2021 1054   MCV 90.8 03/21/2021 1054   MCH 29.6 03/21/2021 1054   MCHC 32.6 03/21/2021 1054   RDW 12.7 03/21/2021 1054   LYMPHSABS 1,977 03/08/2020 0854   MONOABS 0.6  12/29/2006 1013   EOSABS 100 03/08/2020 0854   BASOSABS 30 03/08/2020 0854    Hgb A1C Lab Results  Component Value Date   HGBA1C 5.5 03/21/2021           Assessment & Plan:   Preventative Health Maintenance:  Flu shot today Advised her if she gets bit or cut that she will need to go get a tetanus vaccine Encouraged her to get her COVID booster Prevnar 20 today Will get Pneumovax in 1 year Discussed Shingrix vaccine, if  she would like to get this she will have this done at the pharmacy She no longer needs Pap smears She declines mammogram She declines bone density She declines referral to GI for screening colonoscopy Encouraged her to consume a balanced diet and exercise regimen Advised her to see an eye doctor and dentist annually We will check CBC, c-Met, lipid and hep C today  RTC in 6 months, follow-up chronic conditions   Webb Silversmith, NP This visit occurred during the SARS-CoV-2 public health emergency.  Safety protocols were in place, including screening questions prior to the visit, additional usage of staff PPE, and extensive cleaning of exam room while observing appropriate contact time as indicated for disinfecting solutions.

## 2021-10-02 NOTE — Addendum Note (Signed)
Addended by: Zada Girt on: 10/02/2021 09:28 AM   Modules accepted: Orders

## 2021-10-02 NOTE — Assessment & Plan Note (Signed)
Encourage diet and exercise for weight loss 

## 2021-10-02 NOTE — Patient Instructions (Signed)
Health Maintenance for Postmenopausal Women ?Menopause is a normal process in which your ability to get pregnant comes to an end. This process happens slowly over many months or years, usually between the ages of 48 and 55. Menopause is complete when you have missed your menstrual period for 12 months. ?It is important to talk with your health care provider about some of the most common conditions that affect women after menopause (postmenopausal women). These include heart disease, cancer, and bone loss (osteoporosis). Adopting a healthy lifestyle and getting preventive care can help to promote your health and wellness. The actions you take can also lower your chances of developing some of these common conditions. ?What are the signs and symptoms of menopause? ?During menopause, you may have the following symptoms: ?Hot flashes. These can be moderate or severe. ?Night sweats. ?Decrease in sex drive. ?Mood swings. ?Headaches. ?Tiredness (fatigue). ?Irritability. ?Memory problems. ?Problems falling asleep or staying asleep. ?Talk with your health care provider about treatment options for your symptoms. ?Do I need hormone replacement therapy? ?Hormone replacement therapy is effective in treating symptoms that are caused by menopause, such as hot flashes and night sweats. ?Hormone replacement carries certain risks, especially as you become older. If you are thinking about using estrogen or estrogen with progestin, discuss the benefits and risks with your health care provider. ?How can I reduce my risk for heart disease and stroke? ?The risk of heart disease, heart attack, and stroke increases as you age. One of the causes may be a change in the body's hormones during menopause. This can affect how your body uses dietary fats, triglycerides, and cholesterol. Heart attack and stroke are medical emergencies. There are many things that you can do to help prevent heart disease and stroke. ?Watch your blood pressure ?High  blood pressure causes heart disease and increases the risk of stroke. This is more likely to develop in people who have high blood pressure readings or are overweight. ?Have your blood pressure checked: ?Every 3-5 years if you are 18-39 years of age. ?Every year if you are 40 years old or older. ?Eat a healthy diet ? ?Eat a diet that includes plenty of vegetables, fruits, low-fat dairy products, and lean protein. ?Do not eat a lot of foods that are high in solid fats, added sugars, or sodium. ?Get regular exercise ?Get regular exercise. This is one of the most important things you can do for your health. Most adults should: ?Try to exercise for at least 150 minutes each week. The exercise should increase your heart rate and make you sweat (moderate-intensity exercise). ?Try to do strengthening exercises at least twice each week. Do these in addition to the moderate-intensity exercise. ?Spend less time sitting. Even light physical activity can be beneficial. ?Other tips ?Work with your health care provider to achieve or maintain a healthy weight. ?Do not use any products that contain nicotine or tobacco. These products include cigarettes, chewing tobacco, and vaping devices, such as e-cigarettes. If you need help quitting, ask your health care provider. ?Know your numbers. Ask your health care provider to check your cholesterol and your blood sugar (glucose). Continue to have your blood tested as directed by your health care provider. ?Do I need screening for cancer? ?Depending on your health history and family history, you may need to have cancer screenings at different stages of your life. This may include screening for: ?Breast cancer. ?Cervical cancer. ?Lung cancer. ?Colorectal cancer. ?What is my risk for osteoporosis? ?After menopause, you may be   at increased risk for osteoporosis. Osteoporosis is a condition in which bone destruction happens more quickly than new bone creation. To help prevent osteoporosis or  the bone fractures that can happen because of osteoporosis, you may take the following actions: ?If you are 19-50 years old, get at least 1,000 mg of calcium and at least 600 international units (IU) of vitamin D per day. ?If you are older than age 50 but younger than age 70, get at least 1,200 mg of calcium and at least 600 international units (IU) of vitamin D per day. ?If you are older than age 70, get at least 1,200 mg of calcium and at least 800 international units (IU) of vitamin D per day. ?Smoking and drinking excessive alcohol increase the risk of osteoporosis. Eat foods that are rich in calcium and vitamin D, and do weight-bearing exercises several times each week as directed by your health care provider. ?How does menopause affect my mental health? ?Depression may occur at any age, but it is more common as you become older. Common symptoms of depression include: ?Feeling depressed. ?Changes in sleep patterns. ?Changes in appetite or eating patterns. ?Feeling an overall lack of motivation or enjoyment of activities that you previously enjoyed. ?Frequent crying spells. ?Talk with your health care provider if you think that you are experiencing any of these symptoms. ?General instructions ?See your health care provider for regular wellness exams and vaccines. This may include: ?Scheduling regular health, dental, and eye exams. ?Getting and maintaining your vaccines. These include: ?Influenza vaccine. Get this vaccine each year before the flu season begins. ?Pneumonia vaccine. ?Shingles vaccine. ?Tetanus, diphtheria, and pertussis (Tdap) booster vaccine. ?Your health care provider may also recommend other immunizations. ?Tell your health care provider if you have ever been abused or do not feel safe at home. ?Summary ?Menopause is a normal process in which your ability to get pregnant comes to an end. ?This condition causes hot flashes, night sweats, decreased interest in sex, mood swings, headaches, or lack  of sleep. ?Treatment for this condition may include hormone replacement therapy. ?Take actions to keep yourself healthy, including exercising regularly, eating a healthy diet, watching your weight, and checking your blood pressure and blood sugar levels. ?Get screened for cancer and depression. Make sure that you are up to date with all your vaccines. ?This information is not intended to replace advice given to you by your health care provider. Make sure you discuss any questions you have with your health care provider. ?Document Revised: 01/22/2021 Document Reviewed: 01/22/2021 ?Elsevier Patient Education ? 2022 [employer] ? ?

## 2021-10-03 LAB — CBC
HCT: 40.8 % (ref 35.0–45.0)
Hemoglobin: 13.5 g/dL (ref 11.7–15.5)
MCH: 29.9 pg (ref 27.0–33.0)
MCHC: 33.1 g/dL (ref 32.0–36.0)
MCV: 90.5 fL (ref 80.0–100.0)
MPV: 10.7 fL (ref 7.5–12.5)
Platelets: 260 10*3/uL (ref 140–400)
RBC: 4.51 10*6/uL (ref 3.80–5.10)
RDW: 12.2 % (ref 11.0–15.0)
WBC: 7.5 10*3/uL (ref 3.8–10.8)

## 2021-10-03 LAB — COMPLETE METABOLIC PANEL WITH GFR
AG Ratio: 2.2 (calc) (ref 1.0–2.5)
ALT: 12 U/L (ref 6–29)
AST: 15 U/L (ref 10–35)
Albumin: 4.2 g/dL (ref 3.6–5.1)
Alkaline phosphatase (APISO): 58 U/L (ref 37–153)
BUN/Creatinine Ratio: 12 (calc) (ref 6–22)
BUN: 13 mg/dL (ref 7–25)
CO2: 33 mmol/L — ABNORMAL HIGH (ref 20–32)
Calcium: 9.4 mg/dL (ref 8.6–10.4)
Chloride: 104 mmol/L (ref 98–110)
Creat: 1.05 mg/dL — ABNORMAL HIGH (ref 0.60–1.00)
Globulin: 1.9 g/dL (calc) (ref 1.9–3.7)
Glucose, Bld: 97 mg/dL (ref 65–99)
Potassium: 3.8 mmol/L (ref 3.5–5.3)
Sodium: 141 mmol/L (ref 135–146)
Total Bilirubin: 1.3 mg/dL — ABNORMAL HIGH (ref 0.2–1.2)
Total Protein: 6.1 g/dL (ref 6.1–8.1)
eGFR: 56 mL/min/{1.73_m2} — ABNORMAL LOW (ref >=60)

## 2021-10-03 LAB — LIPID PANEL
Cholesterol: 135 mg/dL (ref <200)
HDL: 57 mg/dL (ref >=50)
LDL Cholesterol (Calc): 54 mg/dL (calc)
Non-HDL Cholesterol (Calc): 78 mg/dL (calc) (ref <130)
Total CHOL/HDL Ratio: 2.4 (calc) (ref <5.0)
Triglycerides: 159 mg/dL — ABNORMAL HIGH (ref <150)

## 2021-10-03 LAB — HEPATITIS C ANTIBODY
Hepatitis C Ab: NONREACTIVE
SIGNAL TO CUT-OFF: 0.2 (ref <1.00)

## 2021-10-20 IMAGING — MR MR HEAD WO/W CM
14 of 15 series · 45 of 48 positions shown · IV contrast (gadavist)
Comparison: CT head 04/15/2008.

CLINICAL DATA: Memory loss.  Personal history of meningioma.

EXAM:
MRI HEAD WITHOUT AND WITH CONTRAST
TECHNIQUE: Multiplanar, multiecho pulse sequences of the brain and surrounding
structures were obtained without and with intravenous contrast.
CONTRAST:  9mL GADAVIST GADOBUTROL 1 MMOL/ML IV SOLN

[Series 5: ax dwi_tracew · axial · 3.0mm · 0.60mm/px · z∈[-88,+64]mm · 5 of 96 slices shown]
[im 1/96]
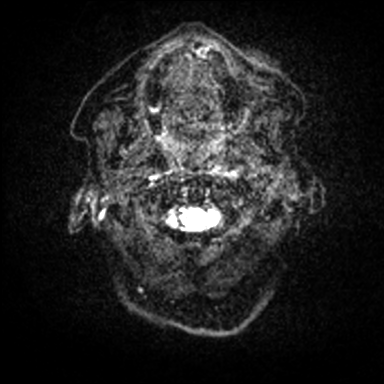
[im 24/96]
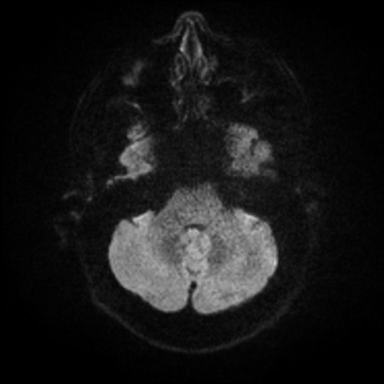
[im 48/96]
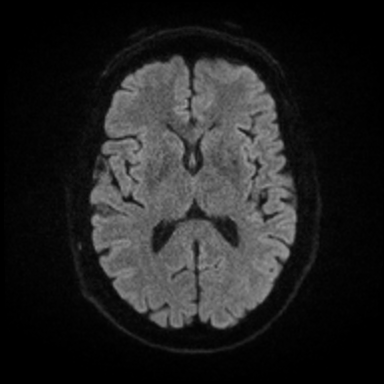
[im 72/96]
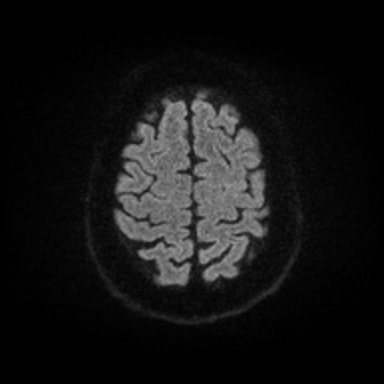
[im 96/96]
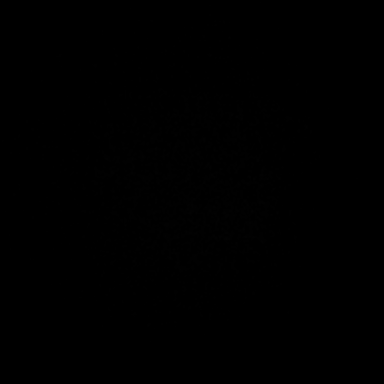

[Series 6: ax dwi_adc · axial · 3.0mm · 0.60mm/px · z∈[-88,+61]mm · 2 of 47 slices shown]
[im 1/47]
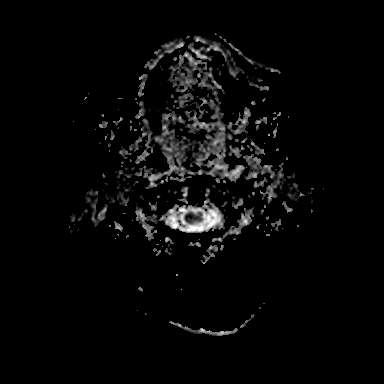
[im 47/47]
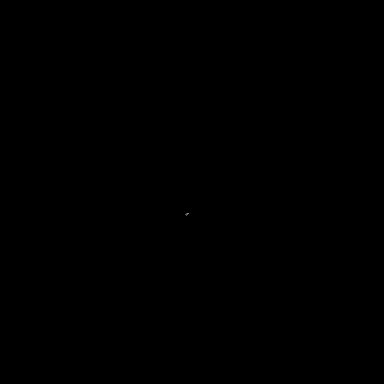

[Series 7: cor dwi_tracew · coronal · 5.0mm · 0.60mm/px · 4 of 68 slices shown]
[im 1/68]
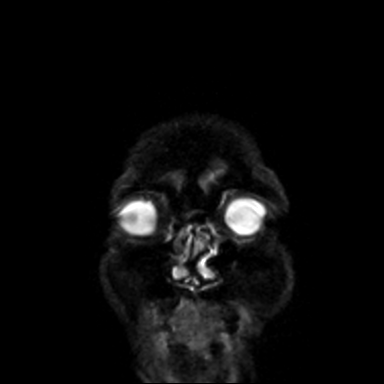
[im 23/68]
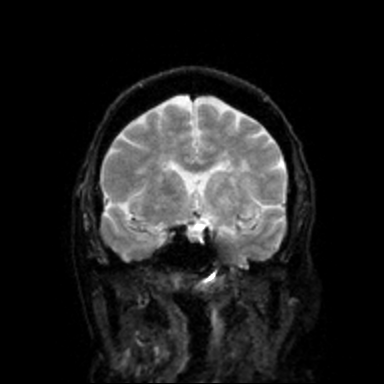
[im 45/68]
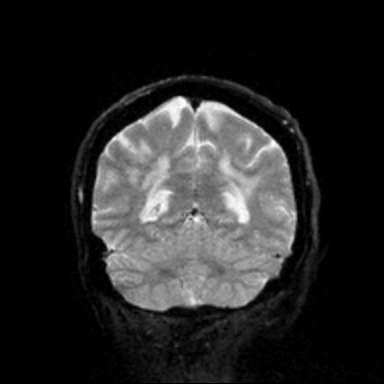
[im 68/68]
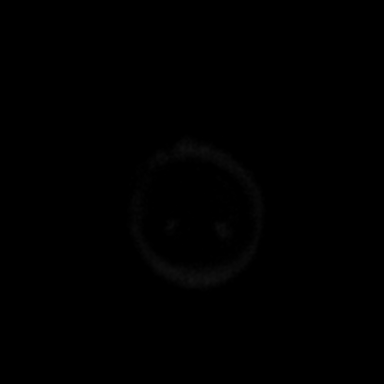

[Series 8: cor dwi_adc · coronal · 5.0mm · 0.60mm/px · 2 of 34 slices shown]
[im 1/34]
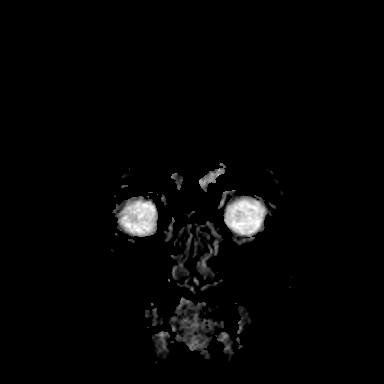
[im 34/34]
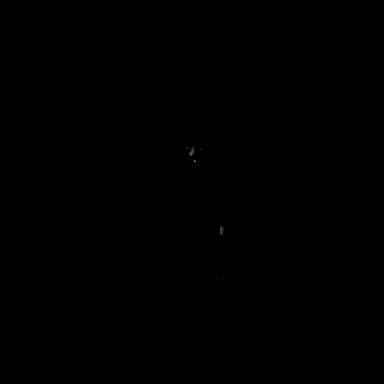

[Series 9: T1 · sagittal · 5.0mm · 0.62mm/px · 1 of 22 slices shown (1 of 2)]
[im 1/22]
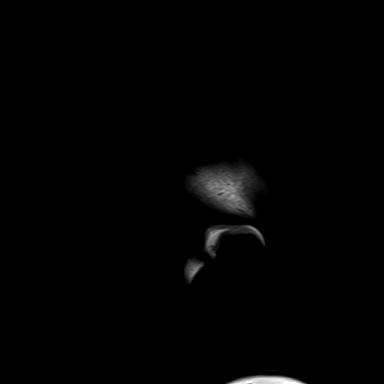

[Series 10: T2 · axial · 5.0mm · 0.53mm/px · 1 of 25 slices shown]
[im 1/25]
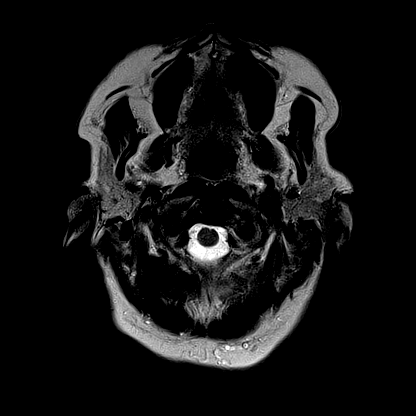

[Series 11: mag_images · axial · 3.0mm · 0.90mm/px · z∈[-100,+74]mm · 3 of 60 slices shown]
[im 1/60]
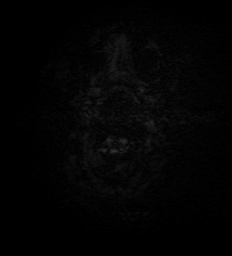
[im 30/60]
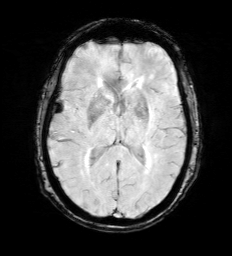
[im 60/60]
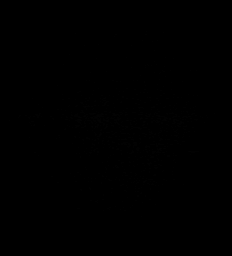

[Series 12: pha_images · axial · 3.0mm · 0.90mm/px · z∈[-100,+71]mm · 3 of 56 slices shown]
[im 1/56]
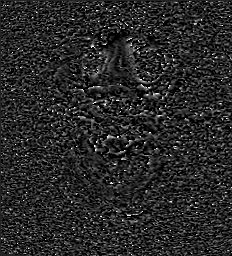
[im 28/56]
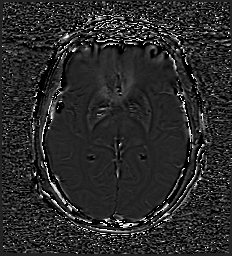
[im 56/56]
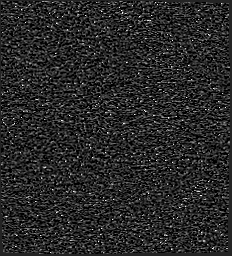

[Series 15: FLAIR · axial · 3.0mm · 0.53mm/px · z∈[-92,+67]mm · 3 of 55 slices shown]
[im 1/55]
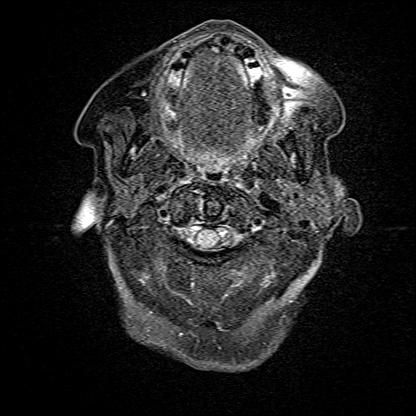
[im 28/55]
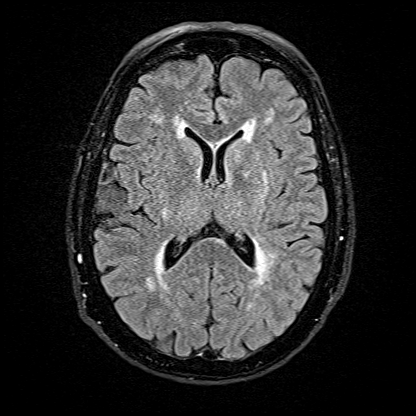
[im 55/55]
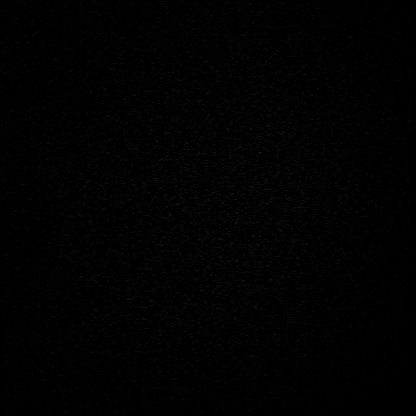

[Series 16: T1 · axial · 1.0mm · 0.98mm/px · z∈[-93,+63]mm · 8 of 160 slices shown (2 of 2)]
[im 1/160]
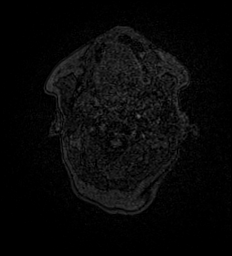
[im 23/160]
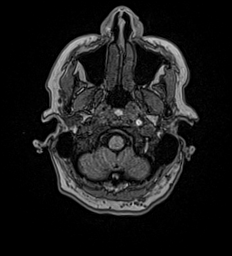
[im 46/160]
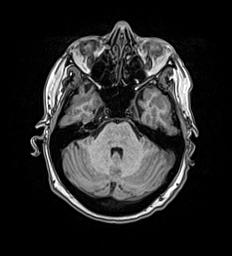
[im 69/160]
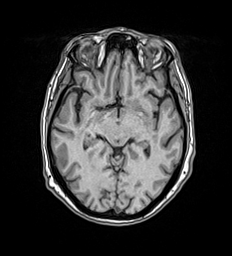
[im 91/160]
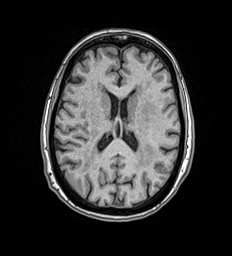
[im 114/160]
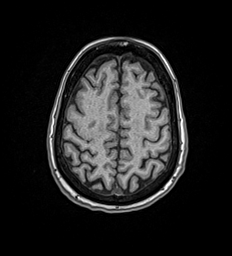
[im 137/160]
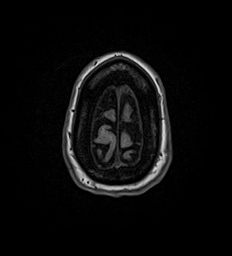
[im 160/160]
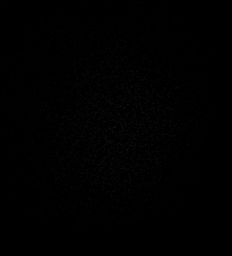

[Series 17: T2 post-contrast · coronal · 5.0mm · 0.57mm/px · 2 of 29 slices shown]
[im 1/29]
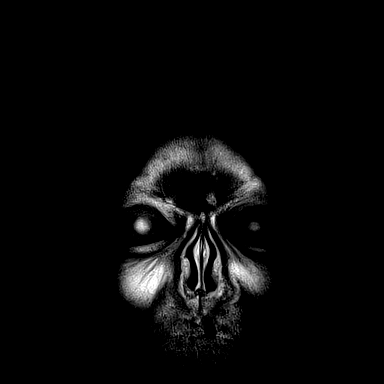
[im 29/29]
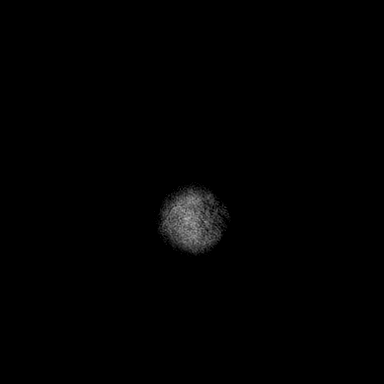

[Series 18: T1 post-contrast · axial · 1.0mm · 0.98mm/px · z∈[-93,+63]mm · 8 of 160 slices shown (1 of 3)]
[im 1/160]
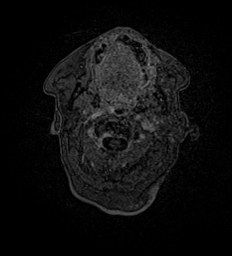
[im 23/160]
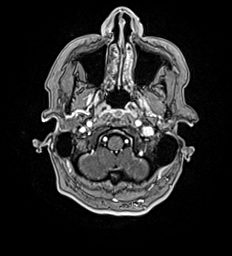
[im 46/160]
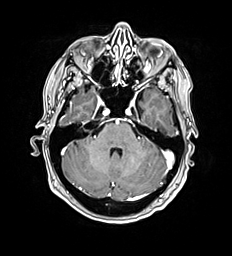
[im 69/160]
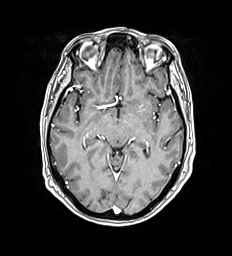
[im 91/160]
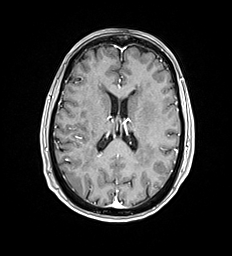
[im 114/160]
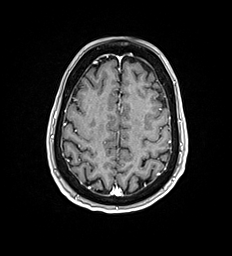
[im 137/160]
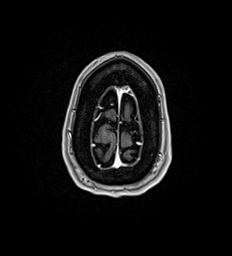
[im 160/160]
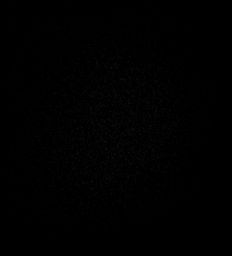

[Series 19: T1 post-contrast · coronal · 5.0mm · 0.57mm/px · 2 of 29 slices shown (2 of 3)]
[im 1/29]
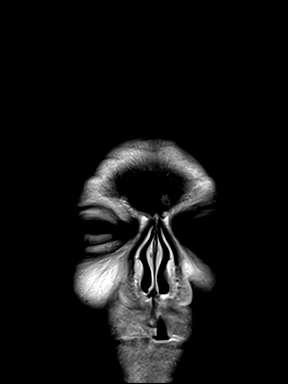
[im 29/29]
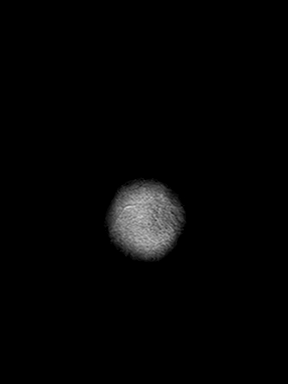

[Series 20: T1 post-contrast · sagittal · 5.0mm · 0.62mm/px · 1 of 22 slices shown (3 of 3)]
[im 1/22]
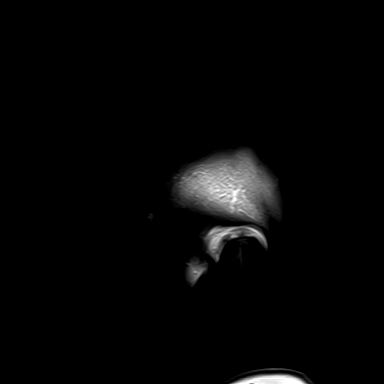

[45 of 48 positions shown; findings below may reference images not displayed]

FINDINGS: Brain: Confluent periventricular and subcortical T2 hyperintensities
bilaterally are moderately advanced for age. Diffuse white matter
changes extend into the brainstem. A remote lacunar infarct is
present in the right cerebellum.

Chronic ischemic changes are present the basal ganglia and thalami.

No parenchymal mass lesion or hematoma is present. A right
frontotemporal meningioma is relatively stable in size, measuring
1.5 x 0.8 x 1.2 cm. No other dural-based lesions are present. No
other pathologic enhancement is present.

Vascular: Insert normal flow

Skull and upper cervical spine: The craniocervical junction is
normal. Upper cervical spine is within normal limits. Marrow signal
is unremarkable.

Sinuses/Orbits: The paranasal sinuses and mastoid air cells are
clear. The globes and orbits are within normal limits.
IMPRESSION: 1. No acute intracranial abnormality or significant interval change.
2. Stable right frontotemporal meningioma.
3. Atrophy and white matter disease is moderately advanced for age.
This likely reflects the sequela of chronic microvascular ischemia
and may be related to progressive memory loss.

## 2021-11-16 DIAGNOSIS — R55 Syncope and collapse: Secondary | ICD-10-CM | POA: Diagnosis not present

## 2021-11-16 DIAGNOSIS — I10 Essential (primary) hypertension: Secondary | ICD-10-CM | POA: Diagnosis not present

## 2021-11-16 DIAGNOSIS — Z6832 Body mass index (BMI) 32.0-32.9, adult: Secondary | ICD-10-CM | POA: Diagnosis not present

## 2021-12-19 ENCOUNTER — Other Ambulatory Visit: Payer: Self-pay | Admitting: Internal Medicine

## 2021-12-19 DIAGNOSIS — I10 Essential (primary) hypertension: Secondary | ICD-10-CM

## 2021-12-20 NOTE — Telephone Encounter (Signed)
Requested Prescriptions  ?Pending Prescriptions Disp Refills  ?? chlorthalidone (HYGROTON) 25 MG tablet [Pharmacy Med Name: CHLORTHALIDONE '25MG'$  TABLETS] 90 tablet 0  ?  Sig: TAKE 1 TABLET(25 MG) BY MOUTH DAILY  ?  ? Cardiovascular: Diuretics - Thiazide Failed - 12/19/2021  2:28 PM  ?  ?  Failed - Cr in normal range and within 180 days  ?  Creat  ?Date Value Ref Range Status  ?10/02/2021 1.05 (H) 0.60 - 1.00 mg/dL Final  ?   ?  ?  Failed - Last BP in normal range  ?  BP Readings from Last 1 Encounters:  ?10/02/21 (!) 131/95  ?   ?  ?  Passed - K in normal range and within 180 days  ?  Potassium  ?Date Value Ref Range Status  ?10/02/2021 3.8 3.5 - 5.3 mmol/L Final  ?   ?  ?  Passed - Na in normal range and within 180 days  ?  Sodium  ?Date Value Ref Range Status  ?10/02/2021 141 135 - 146 mmol/L Final  ?   ?  ?  Passed - Valid encounter within last 6 months  ?  Recent Outpatient Visits   ?      ? 2 months ago Encounter for general adult medical examination with abnormal findings  ? [facility] [place], [place] W, NP  ? 9 months ago Anemia due to stage 3a chronic kidney disease ([place])  ? [facility] [place], [place] W, NP  ? 1 year ago Hypertension, unspecified type  ? [facility], Lupita Raider, FNP  ? 1 year ago Annual physical exam  ? [facility], Lupita Raider, FNP  ? 1 year ago Weight gain  ? [facility], Lupita Raider, FNP  ?  ?  ?Future Appointments   ?        ? In 3 months Baity, Coralie Keens, NP [facility] [place]  ?  ? ?  ?  ?  ? ? ?

## 2021-12-27 ENCOUNTER — Other Ambulatory Visit: Payer: Self-pay | Admitting: Internal Medicine

## 2021-12-27 DIAGNOSIS — E785 Hyperlipidemia, unspecified: Secondary | ICD-10-CM

## 2021-12-27 NOTE — Telephone Encounter (Signed)
Requested Prescriptions  ?Pending Prescriptions Disp Refills  ?? rosuvastatin (CRESTOR) 10 MG tablet [Pharmacy Med Name: ROSUVASTATIN '10MG'$  TABLETS] 90 tablet 0  ?  Sig: TAKE 1 TABLET(10 MG) BY MOUTH DAILY  ?  ? Cardiovascular:  Antilipid - Statins 2 Failed - 12/27/2021  7:09 AM  ?  ?  Failed - Cr in normal range and within 360 days  ?  Creat  ?Date Value Ref Range Status  ?10/02/2021 1.05 (H) 0.60 - 1.00 mg/dL Final  ?   ?  ?  Failed - Lipid Panel in normal range within the last 12 months  ?  Cholesterol  ?Date Value Ref Range Status  ?10/02/2021 135 <200 mg/dL Final  ? ?LDL Cholesterol (Calc)  ?Date Value Ref Range Status  ?10/02/2021 54 mg/dL (calc) Final  ?  Comment:  ?  Reference range: <100 ?Marland Kitchen ?Desirable range <100 mg/dL for primary prevention;   ?<70 mg/dL for patients with CHD or diabetic patients  ?with > or = 2 CHD risk factors. ?. ?LDL-C is now calculated using the Martin-Hopkins  ?calculation, which is a validated novel method providing  ?better accuracy than the Friedewald equation in the  ?estimation of LDL-C.  ?Cresenciano Genre et al. Annamaria Helling. 7893;810(17): 2061-2068  ?(http://education.[employer].com/faq/FAQ164) ?  ? ?HDL  ?Date Value Ref Range Status  ?10/02/2021 57 > OR = 50 mg/dL Final  ? ?Triglycerides  ?Date Value Ref Range Status  ?10/02/2021 159 (H) <150 mg/dL Final  ? ?  ?  ?  Passed - Patient is not pregnant  ?  ?  Passed - Valid encounter within last 12 months  ?  Recent Outpatient Visits   ?      ? 2 months ago Encounter for general adult medical examination with abnormal findings  ? [facility] [place], [place] W, NP  ? 9 months ago Anemia due to stage 3a chronic kidney disease ([place])  ? [facility] [place], [place] W, NP  ? 1 year ago Hypertension, unspecified type  ? [facility], Lupita Raider, FNP  ? 1 year ago Annual physical exam  ? [facility], Lupita Raider, FNP  ? 1 year ago Weight gain  ? [facility],  Lupita Raider, FNP  ?  ?  ?Future Appointments   ?        ? In 3 months Baity, Coralie Keens, NP [facility], [place]  ?  ? ?  ?  ?  ? ? ?

## 2022-02-04 ENCOUNTER — Telehealth: Payer: Self-pay

## 2022-02-04 NOTE — Telephone Encounter (Signed)
Attempted to contact the patient to schedule her medicare wellness visit , no answer. The voicemail doesn't have the patient name, no voicemail was left.

## 2022-03-21 ENCOUNTER — Other Ambulatory Visit: Payer: Self-pay | Admitting: Internal Medicine

## 2022-03-21 DIAGNOSIS — I10 Essential (primary) hypertension: Secondary | ICD-10-CM

## 2022-03-21 NOTE — Telephone Encounter (Signed)
Requested Prescriptions  Pending Prescriptions Disp Refills  . lisinopril (ZESTRIL) 20 MG tablet [Pharmacy Med Name: LISINOPRIL '20MG'$  TABLETS] 90 tablet 0    Sig: TAKE 1 TABLET(20 MG) BY MOUTH DAILY     Cardiovascular:  ACE Inhibitors Failed - 03/21/2022  8:46 AM      Failed - Cr in normal range and within 180 days    Creat  Date Value Ref Range Status  10/02/2021 1.05 (H) 0.60 - 1.00 mg/dL Final         Failed - Last BP in normal range    BP Readings from Last 1 Encounters:  10/02/21 (!) 131/95         Passed - K in normal range and within 180 days    Potassium  Date Value Ref Range Status  10/02/2021 3.8 3.5 - 5.3 mmol/L Final         Passed - Patient is not pregnant      Passed - Valid encounter within last 6 months    Recent Outpatient Visits          5 months ago Encounter for general adult medical examination with abnormal findings   [facility], Coralie Keens, NP   1 year ago Anemia due to stage 3a chronic kidney disease [facility])   [facility], Coralie Keens, NP   1 year ago Hypertension, unspecified type   [facility], Lupita Raider, FNP   1 year ago Annual physical exam   [facility], Lupita Raider, FNP   2 years ago Weight gain   [facility], Lupita Raider, FNP      Future Appointments            In 1 week Garnette Gunner, Coralie Keens, NP [facility]

## 2022-04-01 ENCOUNTER — Ambulatory Visit (INDEPENDENT_AMBULATORY_CARE_PROVIDER_SITE_OTHER): Payer: Medicare HMO | Admitting: Internal Medicine

## 2022-04-01 ENCOUNTER — Encounter: Payer: Self-pay | Admitting: Internal Medicine

## 2022-04-01 VITALS — BP 158/94 | HR 78 | Temp 97.1°F | Wt 190.0 lb

## 2022-04-01 DIAGNOSIS — M179 Osteoarthritis of knee, unspecified: Secondary | ICD-10-CM | POA: Insufficient documentation

## 2022-04-01 DIAGNOSIS — I10 Essential (primary) hypertension: Secondary | ICD-10-CM | POA: Diagnosis not present

## 2022-04-01 DIAGNOSIS — M1711 Unilateral primary osteoarthritis, right knee: Secondary | ICD-10-CM | POA: Diagnosis not present

## 2022-04-01 DIAGNOSIS — F419 Anxiety disorder, unspecified: Secondary | ICD-10-CM | POA: Diagnosis not present

## 2022-04-01 DIAGNOSIS — N1831 Chronic kidney disease, stage 3a: Secondary | ICD-10-CM

## 2022-04-01 DIAGNOSIS — E6609 Other obesity due to excess calories: Secondary | ICD-10-CM

## 2022-04-01 DIAGNOSIS — E782 Mixed hyperlipidemia: Secondary | ICD-10-CM | POA: Diagnosis not present

## 2022-04-01 DIAGNOSIS — D332 Benign neoplasm of brain, unspecified: Secondary | ICD-10-CM

## 2022-04-01 DIAGNOSIS — F32A Depression, unspecified: Secondary | ICD-10-CM

## 2022-04-01 DIAGNOSIS — Z6834 Body mass index (BMI) 34.0-34.9, adult: Secondary | ICD-10-CM

## 2022-04-01 DIAGNOSIS — E0590 Thyrotoxicosis, unspecified without thyrotoxic crisis or storm: Secondary | ICD-10-CM

## 2022-04-01 DIAGNOSIS — I2510 Atherosclerotic heart disease of native coronary artery without angina pectoris: Secondary | ICD-10-CM

## 2022-04-01 HISTORY — DX: Osteoarthritis of knee, unspecified: M17.9

## 2022-04-01 MED ORDER — LISINOPRIL 20 MG PO TABS: 40.0000 mg | ORAL_TABLET | Freq: Every day | ORAL | 0 refills | Status: DC

## 2022-04-01 MED ORDER — ASPIRIN 81 MG PO TBEC: 81.0000 mg | DELAYED_RELEASE_TABLET | Freq: Every day | ORAL | 12 refills | Status: DC

## 2022-04-01 NOTE — Assessment & Plan Note (Signed)
Uncontrolled on lisinopril, will increase to 40 mg daily Reinforced DASH diet and exercise for weight loss C-Met today  RTC in 2 weeks for follow-up HTN

## 2022-04-01 NOTE — Assessment & Plan Note (Signed)
TSH and free T4 today

## 2022-04-01 NOTE — Patient Instructions (Signed)
Coronary Artery Disease, Female Coronary artery disease (CAD) is a condition in which the arteries that lead to the heart (coronary arteries) become narrow or blocked. The narrowing or blockage can lead to decreased blood flow to the heart. Prolonged reduced blood flow can cause a heart attack (myocardial infarction, or MI). This condition may also be called coronary heart disease. CAD is the most common type of heart disease, and heart disease is the leading cause of death in women. It is important to understand what causes CAD and how it is treated. What are the causes? CAD is most often caused by atherosclerosis. This is the buildup of fat and cholesterol (plaque) on the inside of the arteries. Over time, the plaque may narrow or block the artery, reducing blood flow to the heart. Plaque can also become weak and break off within a coronary artery and cause a sudden blockage. Other less common causes of CAD include: A blood clot or a piece of another substance that blocks the flow of blood in a coronary artery (embolism). A tearing of the artery (spontaneous coronary artery dissection). An enlargement of an artery (aneurysm). Inflammation (vasculitis) in the artery wall. What increases the risk? The following factors may make you more likely to develop this condition: Age. Women older than 70 years are at a greater risk of CAD. Family history of CAD. High blood pressure (hypertension). Diabetes. High cholesterol levels. Obesity. Menopause. All postmenopausal women are at greater risk of CAD. Women who have experienced menopause between the ages of 46 and 72 (early menopause) are at a higher risk of CAD. Women who have experienced menopause before age 74 (premature menopause) are at a very high risk of CAD. Other risk factors include: Tobacco use. Excessive alcohol use. Lack of exercise. A diet high in saturated and trans fats, such as fried food and processed meat. What are the signs or  symptoms? Many people do not have any symptoms during the early stages of CAD. As the condition progresses, symptoms may include: Chest pain (angina). The pain can: Feel like crushing or squeezing, or like a tightness, pressure, fullness, or heaviness in the chest. Last more than a few minutes or can stop and recur. The pain tends to get worse with exercise or stress and to fade with rest. Pain in the arms, neck, jaw, ear, or back. Unexplained heartburn or indigestion. Shortness of breath. Nausea. Sudden light-headedness. Sudden cold sweats. Fluttering or fast heartbeat (palpitations). Many women have chest discomfort and the other symptoms. However, women often have unusual (atypical) symptoms, such as: Fatigue. Vomiting. Unexplained feelings of nervousness or anxiety. Unexplained weakness. Dizziness or fainting. How is this diagnosed? This condition is diagnosed based on: Your family and medical history. A physical exam. Tests. These may include: A test to check the electrical signals in your heart (electrocardiogram). Exercise stress test. This looks for signs of blockage when the heart is stressed with exercise, such as running on a treadmill. Pharmacologic stress test. This test looks for signs of blockage when the heart is being stressed with a medicine. Blood tests to check levels of cardiac enzymes such as troponin and creatine kinase. Coronary angiogram. This is a procedure to look at the coronary arteries to see if there is any blockage. During this test, a dye is injected into your arteries so they appear on an X-ray. Coronary artery CT scan. This scan helps detect calcium deposits in your coronary arteries. Calcium deposits are an indicator of CAD. A test that uses  sound waves to take a picture of your heart (echocardiogram). How is this treated? This condition may be treated by: Healthy lifestyle changes to reduce risk factors. Medicines such as: Antiplatelet medicines  such as clopidogrel or aspirin. These help to prevent blood clots. Nitroglycerin. Blood pressure medicines. Cholesterol-lowering medicine. Coronary angioplasty and stenting. During this procedure, a thin, flexible tube is inserted through a blood vessel and into a blocked artery. A balloon or similar device on the end of the tube is inflated to open up the artery. In some cases, a small, mesh tube (stent) is inserted into the artery to keep it open. Coronary artery bypass surgery. During this surgery, veins or arteries from other parts of the body are used to create a bypass around the blockage and allow blood to reach your heart. Follow these instructions at home: Medicines Take over-the-counter and prescription medicines only as told by your health care provider. Do not take the following medicines unless your health care provider approves: NSAIDs, such as ibuprofen, naproxen, or celecoxib. Vitamin supplements that contain vitamin A, vitamin E, or both. Hormone replacement therapy that contains estrogen with or without progestin. Lifestyle Follow an exercise program approved by your health care provider. Ask your health care provider if cardiac rehab is appropriate. Maintain a healthy weight or lose weight as approved by your health care provider. Learn to manage stress or try to limit your stress. Ask your health care provider for suggestions if you need help. Get screened for depression and seek treatment, if needed. Do not use any products that contain nicotine or tobacco. These products include cigarettes, chewing tobacco, and vaping devices, such as e-cigarettes. If you need help quitting, ask your health care provider. Eating and drinking  Follow a heart-healthy diet. A dietitian can help educate you about healthy food options and changes. In general, eat plenty of fruits and vegetables, lean meats, and whole grains. Avoid foods high in: Sugar. Salt (sodium). Saturated fats, such as  processed or fatty meat. Trans fats, such as fried food. Use healthy cooking methods such as roasting, grilling, broiling, baking, poaching, steaming, or stir-frying. Do not drink alcohol if: Your health care provider tells you not to drink. You are pregnant, may be pregnant, or are planning to become pregnant. If you drink alcohol: Limit how much you have to 0-1 drink a day. Know how much alcohol is in your drink. In the [place] one drink equals one 12 oz bottle of beer (355 mL), one 5 oz glass of wine (148 mL), or one 1 oz glass of hard liquor (44 mL). General instructions Manage any other health conditions, such as high cholesterol, hypertension, and diabetes. These conditions affect your heart. Your health care provider may ask you to monitor your blood pressure. Keep all follow-up visits. This is important. Get help right away if: You have pain in your chest, neck, ear, arm, jaw, stomach, or back that: Lasts more than a few minutes. Is recurring. Is not relieved by taking medicine under your tongue (sublingual nitroglycerin). You have profuse sweating without cause. You have unexplained: Heartburn or indigestion. Shortness of breath or difficulty breathing. Fluttering or fast heartbeat (palpitations). Fatigue or weakness. Nausea or vomiting. Feelings of nervousness or anxiety. You have sudden light-headedness or dizziness. You faint. These symptoms may be an emergency. Get help right away. Call 911. Do not wait to see if the symptoms will go away. Do not drive yourself to the hospital. Summary Coronary artery disease (CAD) is a condition in  which the arteries that lead to the heart (coronary arteries) become narrow or blocked. Prolonged reduced blood flow can cause a heart attack. Many women have chest discomfort and other common symptoms of CAD. However, women often have unusual (atypical) symptoms, such as fatigue, vomiting, weakness, or dizziness. CAD can be treated with  lifestyle changes, medicines, coronary angioplasty or stents, coronary artery bypass surgery, or a combination of these treatments. Keep all follow-up visits. This is important. This information is not intended to replace advice given to you by your health care provider. Make sure you discuss any questions you have with your health care provider. Document Revised: 08/01/2021 Document Reviewed: 08/01/2021 Elsevier Patient Education  [employer].

## 2022-04-01 NOTE — Assessment & Plan Note (Signed)
C-Met and lipid profile today Encouraged her to consume a low-fat diet Continue rosuvastatin We will add aspirin 81 mg daily

## 2022-04-01 NOTE — Assessment & Plan Note (Signed)
Stable off meds We will monitor 

## 2022-04-01 NOTE — Assessment & Plan Note (Addendum)
Advised her to stop ibuprofen and start Tylenol 500 mg 3 times daily

## 2022-04-01 NOTE — Assessment & Plan Note (Signed)
Follows with neurosurgery

## 2022-04-01 NOTE — Assessment & Plan Note (Signed)
C-Met today Advised her to stop ibuprofen and start taking Tylenol 500 mg 2 times daily

## 2022-04-01 NOTE — Assessment & Plan Note (Signed)
C-Met and lipid profile today  Encouraged her to consume a low-fat diet Continue rosuvastatin 

## 2022-04-01 NOTE — Assessment & Plan Note (Signed)
Encourage diet and exercise for weight loss 

## 2022-04-01 NOTE — Progress Notes (Signed)
Subjective:    Patient ID: Leslie Randall, female    DOB: 1951/04/15, 71 y.o.   MRN: [id number]  HPI  Patient presents to clinic today for 63-monthfollow-up of chronic conditions.  HTN: Her BP today is 164/98.  She is taking Lisinopril as prescribed.  There is no ECG on file.  HLD with CAD: Her last LDL was 54, triglycerides 159, 09/2021.  She denies myalgias on Rosuvastatin.  She tries to consume a low-fat diet.  Hyperthyroidism: She is not currently taking any medications for this.  She does not follow with endocrinology.  OA: Mainly in her right knee. She takes Ibuprofen 2 x daily with good relief of symptoms. She does not follow with orthopedics.  Anxiety and Depression: Currently not an issue.  She is not taking any medications for this.  She is not currently seeing a therapist.  She denies SI/HI.  CKD 3: Her last creatinine was 1.05, GFR 56, 09/2021.  She is on Lisinopril for renal protection.  She does not follow with nephrology.  Brain Tumor: She has not had surgery, chemoradiation. She follows with neurosurgery.  Review of Systems     Past Medical History:  Diagnosis Date   Anemia    Brain tumor (benign) (HCC)    CAD (coronary artery disease)    Depression    Hyperlipidemia    Hypertension    Menopause    Osteoarthritis of both knees    Vitamin D deficiency     Current Outpatient Medications  Medication Sig Dispense Refill   Biotin 1 MG CAPS Take by mouth.     chlorthalidone (HYGROTON) 25 MG tablet TAKE 1 TABLET(25 MG) BY MOUTH DAILY 90 tablet 0   diclofenac Sodium (VOLTAREN) 1 % GEL Apply 2 g topically 4 (four) times daily. (Patient not taking: Reported on 10/02/2021) 50 g 1   fexofenadine (ALLEGRA ALLERGY) 180 MG tablet Take 1 tablet (180 mg total) by mouth daily. 90 tablet 0   ibuprofen (ADVIL) 200 MG tablet Take 400 mg by mouth 2 (two) times daily.     lisinopril (ZESTRIL) 20 MG tablet TAKE 1 TABLET(20 MG) BY MOUTH DAILY 90 tablet 0   rosuvastatin (CRESTOR) 10  MG tablet TAKE 1 TABLET(10 MG) BY MOUTH DAILY 90 tablet 0   No current facility-administered medications for this visit.    No Known Allergies  Family History  Problem Relation Age of Onset   Heart disease Mother    Stroke Father    Diabetes Father     Social History   Socioeconomic History   Marital status: Married    Spouse name: Not on file   Number of children: Not on file   Years of education: Not on file   Highest education level: Not on file  Occupational History   Not on file  Tobacco Use   Smoking status: Former   Smokeless tobacco: Current    Types: Snuff  Vaping Use   Vaping Use: Never used  Substance and Sexual Activity   Alcohol use: Not Currently   Drug use: Never   Sexual activity: Not on file  Other Topics Concern   Not on file  Social History Narrative   Not on file   Social Determinants of Health   Financial Resource Strain: Not on file  Food Insecurity: Not on file  Transportation Needs: Not on file  Physical Activity: Not on file  Stress: Not on file  Social Connections: Not on file  Intimate Partner Violence:  Not on file     Constitutional: Denies fever, malaise, fatigue, headache or abrupt weight changes.  HEENT: Denies eye pain, eye redness, ear pain, ringing in the ears, wax buildup, runny nose, nasal congestion, bloody nose, or sore throat. Respiratory: Denies difficulty breathing, shortness of breath, cough or sputum production.   Cardiovascular: Denies chest pain, chest tightness, palpitations or swelling in the hands or feet.  Gastrointestinal: Denies abdominal pain, bloating, constipation, diarrhea or blood in the stool.  GU: Denies urgency, frequency, pain with urination, burning sensation, blood in urine, odor or discharge. Musculoskeletal: Patient reports intermittent right knee pain.  Denies decrease in range of motion, difficulty with gait, muscle pain or joint swelling.  Skin: Denies redness, rashes, lesions or ulcercations.   Neurological: Denies dizziness, difficulty with memory, difficulty with speech or problems with balance and coordination.  Psych: Patient has a history of anxiety and depression.  Denies SI/HI.  No other specific complaints in a complete review of systems (except as listed in HPI above).  Objective:   Physical Exam  BP (!) 164/98 (BP Location: Left Arm, Patient Position: Sitting, Cuff Size: Large)   Pulse 78   Temp (!) 97.1 F (36.2 C) (Temporal)   Wt 190 lb (86.2 kg)   SpO2 99%   BMI 34.20 kg/m   Wt Readings from Last 3 Encounters:  10/02/21 199 lb (90.3 kg)  03/21/21 213 lb 12.8 oz (97 kg)  09/25/20 216 lb 6.4 oz (98.2 kg)    General: Appears her stated age, obese, in NAD. Skin: Warm, dry and intact.  HEENT: Head: normal shape and size; Eyes: sclera white, no icterus, conjunctiva pink, PERRLA and EOMs intact;  Neck:  Neck supple, trachea midline. No masses, lumps or thyromegaly present.  Cardiovascular: Normal rate and rhythm. S1,S2 noted.  Murmur noted. No JVD or BLE edema. No carotid bruits noted. Pulmonary/Chest: Normal effort and positive vesicular breath sounds. No respiratory distress. No wheezes, rales or ronchi noted.  Musculoskeletal: No pain with palpation of the right knee.  No difficulty with gait.  Neurological: Alert and oriented. Cranial nerves II-XII grossly intact. Coordination normal.  Psychiatric: Mood and affect normal. Behavior is normal. Judgment and thought content normal.   BMET    Component Value Date/Time   NA 141 10/02/2021 0941   K 3.8 10/02/2021 0941   CL 104 10/02/2021 0941   CO2 33 (H) 10/02/2021 0941   GLUCOSE 97 10/02/2021 0941   BUN 13 10/02/2021 0941   CREATININE 1.05 (H) 10/02/2021 0941   CALCIUM 9.4 10/02/2021 0941   GFRNONAA 53 (L) 03/21/2021 1054   GFRAA 62 03/21/2021 1054    Lipid Panel     Component Value Date/Time   CHOL 135 10/02/2021 0941   TRIG 159 (H) 10/02/2021 0941   HDL 57 10/02/2021 0941   CHOLHDL 2.4  10/02/2021 0941   LDLCALC 54 10/02/2021 0941    CBC    Component Value Date/Time   WBC 7.5 10/02/2021 0941   RBC 4.51 10/02/2021 0941   HGB 13.5 10/02/2021 0941   HCT 40.8 10/02/2021 0941   PLT 260 10/02/2021 0941   MCV 90.5 10/02/2021 0941   MCH 29.9 10/02/2021 0941   MCHC 33.1 10/02/2021 0941   RDW 12.2 10/02/2021 0941   LYMPHSABS 1,977 03/08/2020 0854   MONOABS 0.6 12/29/2006 1013   EOSABS 100 03/08/2020 0854   BASOSABS 30 03/08/2020 0854    Hgb A1C Lab Results  Component Value Date   HGBA1C 5.5 03/21/2021  Assessment & Plan:     RTC in 2 weeks for follow-up HTN, 6 months for your annual exam Webb Silversmith, NP

## 2022-04-02 LAB — COMPLETE METABOLIC PANEL WITH GFR
AG Ratio: 2 (calc) (ref 1.0–2.5)
ALT: 10 U/L (ref 6–29)
AST: 15 U/L (ref 10–35)
Albumin: 4.2 g/dL (ref 3.6–5.1)
Alkaline phosphatase (APISO): 64 U/L (ref 37–153)
BUN: 17 mg/dL (ref 7–25)
CO2: 25 mmol/L (ref 20–32)
Calcium: 9.3 mg/dL (ref 8.6–10.4)
Chloride: 108 mmol/L (ref 98–110)
Creat: 1 mg/dL (ref 0.60–1.00)
Globulin: 2.1 g/dL (calc) (ref 1.9–3.7)
Glucose, Bld: 89 mg/dL (ref 65–139)
Potassium: 3.7 mmol/L (ref 3.5–5.3)
Sodium: 144 mmol/L (ref 135–146)
Total Bilirubin: 1.5 mg/dL — ABNORMAL HIGH (ref 0.2–1.2)
Total Protein: 6.3 g/dL (ref 6.1–8.1)
eGFR: 60 mL/min/{1.73_m2} (ref >=60)

## 2022-04-02 LAB — LIPID PANEL
Cholesterol: 121 mg/dL (ref <200)
HDL: 50 mg/dL (ref >=50)
LDL Cholesterol (Calc): 48 mg/dL (calc)
Non-HDL Cholesterol (Calc): 71 mg/dL (calc) (ref <130)
Total CHOL/HDL Ratio: 2.4 (calc) (ref <5.0)
Triglycerides: 149 mg/dL (ref <150)

## 2022-04-02 LAB — TSH+FREE T4: TSH W/REFLEX TO FT4: 0.86 mIU/L (ref 0.40–4.50)

## 2022-04-16 ENCOUNTER — Ambulatory Visit (INDEPENDENT_AMBULATORY_CARE_PROVIDER_SITE_OTHER): Payer: Medicare HMO | Admitting: Internal Medicine

## 2022-04-16 ENCOUNTER — Encounter: Payer: Self-pay | Admitting: Internal Medicine

## 2022-04-16 ENCOUNTER — Other Ambulatory Visit: Payer: Self-pay | Admitting: Internal Medicine

## 2022-04-16 VITALS — BP 184/102 | HR 71 | Temp 97.9°F | Wt 188.0 lb

## 2022-04-16 DIAGNOSIS — Z6833 Body mass index (BMI) 33.0-33.9, adult: Secondary | ICD-10-CM

## 2022-04-16 DIAGNOSIS — I160 Hypertensive urgency: Secondary | ICD-10-CM | POA: Diagnosis not present

## 2022-04-16 DIAGNOSIS — E6609 Other obesity due to excess calories: Secondary | ICD-10-CM | POA: Diagnosis not present

## 2022-04-16 MED ORDER — AMLODIPINE-OLMESARTAN 10-40 MG PO TABS: 1.0000 | ORAL_TABLET | Freq: Every day | ORAL | 0 refills | Status: DC

## 2022-04-16 NOTE — Assessment & Plan Note (Signed)
Encouraged diet and exercise for weight loss ?

## 2022-04-16 NOTE — Patient Instructions (Signed)
Hypertension, Adult ?Hypertension is another name for high blood pressure. High blood pressure forces your heart to work harder to pump blood. This can cause problems over time. ?There are two numbers in a blood pressure reading. There is a top number (systolic) over a bottom number (diastolic). It is best to have a blood pressure that is below 120/80. ?What are the causes? ?The cause of this condition is not known. Some other conditions can lead to high blood pressure. ?What increases the risk? ?Some lifestyle factors can make you more likely to develop high blood pressure: ?Smoking. ?Not getting enough exercise or physical activity. ?Being overweight. ?Having too much fat, sugar, calories, or salt (sodium) in your diet. ?Drinking too much alcohol. ?Other risk factors include: ?Having any of these conditions: ?Heart disease. ?Diabetes. ?High cholesterol. ?Kidney disease. ?Obstructive sleep apnea. ?Having a family history of high blood pressure and high cholesterol. ?Age. The risk increases with age. ?Stress. ?What are the signs or symptoms? ?High blood pressure may not cause symptoms. Very high blood pressure (hypertensive crisis) may cause: ?Headache. ?Fast or uneven heartbeats (palpitations). ?Shortness of breath. ?Nosebleed. ?Vomiting or feeling like you may vomit (nauseous). ?Changes in how you see. ?Very bad chest pain. ?Feeling dizzy. ?Seizures. ?How is this treated? ?This condition is treated by making healthy lifestyle changes, such as: ?Eating healthy foods. ?Exercising more. ?Drinking less alcohol. ?Your doctor may prescribe medicine if lifestyle changes do not help enough and if: ?Your top number is above 130. ?Your bottom number is above 80. ?Your personal target blood pressure may vary. ?Follow these instructions at home: ?Eating and drinking ? ?If told, follow the DASH eating plan. To follow this plan: ?Fill one half of your plate at each meal with fruits and vegetables. ?Fill one fourth of your plate  at each meal with whole grains. Whole grains include whole-wheat pasta, brown rice, and whole-grain bread. ?Eat or drink low-fat dairy products, such as skim milk or low-fat yogurt. ?Fill one fourth of your plate at each meal with low-fat (lean) proteins. Low-fat proteins include fish, chicken without skin, eggs, beans, and tofu. ?Avoid fatty meat, cured and processed meat, or chicken with skin. ?Avoid pre-made or processed food. ?Limit the amount of salt in your diet to less than 1,500 mg each day. ?Do not drink alcohol if: ?Your doctor tells you not to drink. ?You are pregnant, may be pregnant, or are planning to become pregnant. ?If you drink alcohol: ?Limit how much you have to: ?0-1 drink a day for women. ?0-2 drinks a day for men. ?Know how much alcohol is in your drink. In the [place] one drink equals one 12 oz bottle of beer (355 mL), one 5 oz glass of wine (148 mL), or one 1? oz glass of hard liquor (44 mL). ?Lifestyle ? ?Work with your doctor to stay at a healthy weight or to lose weight. Ask your doctor what the best weight is for you. ?Get at least 30 minutes of exercise that causes your heart to beat faster (aerobic exercise) most days of the week. This may include walking, swimming, or biking. ?Get at least 30 minutes of exercise that strengthens your muscles (resistance exercise) at least 3 days a week. This may include lifting weights or doing Pilates. ?Do not smoke or use any products that contain nicotine or tobacco. If you need help quitting, ask your doctor. ?Check your blood pressure at home as told by your doctor. ?Keep all follow-up visits. ?Medicines ?Take over-the-counter and prescription medicines   only as told by your doctor. Follow directions carefully. ?Do not skip doses of blood pressure medicine. The medicine does not work as well if you skip doses. Skipping doses also puts you at risk for problems. ?Ask your doctor about side effects or reactions to medicines that you should watch  for. ?Contact a doctor if: ?You think you are having a reaction to the medicine you are taking. ?You have headaches that keep coming back. ?You feel dizzy. ?You have swelling in your ankles. ?You have trouble with your vision. ?Get help right away if: ?You get a very bad headache. ?You start to feel mixed up (confused). ?You feel weak or numb. ?You feel faint. ?You have very bad pain in your: ?Chest. ?Belly (abdomen). ?You vomit more than once. ?You have trouble breathing. ?These symptoms may be an emergency. Get help right away. Call 911. ?Do not wait to see if the symptoms will go away. ?Do not drive yourself to the hospital. ?Summary ?Hypertension is another name for high blood pressure. ?High blood pressure forces your heart to work harder to pump blood. ?For most people, a normal blood pressure is less than 120/80. ?Making healthy choices can help lower blood pressure. If your blood pressure does not get lower with healthy choices, you may need to take medicine. ?This information is not intended to replace advice given to you by your health care provider. Make sure you discuss any questions you have with your health care provider. ?Document Revised: 06/21/2021 Document Reviewed: 06/21/2021 ?Elsevier Patient Education ? 2023 [employer] ? ?

## 2022-04-16 NOTE — Progress Notes (Signed)
Subjective:    Patient ID: Leslie Randall, female    DOB: [birth year], 71 y.o.   MRN: [id number]  HPI  Patient presents to clinic today for 2-week follow-up of HTN.  At her last visit, her Lisinopril was increased to 40 mg daily.  She has been taking the medication as prescribed and denies adverse effects.  Her BP today is 195/117.  She has been under a lot of stress lately. There is no ECG on file.  Review of Systems     Past Medical History:  Diagnosis Date   Anemia    Brain tumor (benign) (HCC)    CAD (coronary artery disease)    Depression    Hyperlipidemia    Hypertension    Menopause    Osteoarthritis of both knees    Vitamin D deficiency     Current Outpatient Medications  Medication Sig Dispense Refill   aspirin EC 81 MG tablet Take 1 tablet (81 mg total) by mouth daily. Swallow whole. 30 tablet 12   Biotin 1 MG CAPS Take by mouth.     diclofenac Sodium (VOLTAREN) 1 % GEL Apply 2 g topically 4 (four) times daily. 50 g 1   fexofenadine (ALLEGRA ALLERGY) 180 MG tablet Take 1 tablet (180 mg total) by mouth daily. 90 tablet 0   lisinopril (ZESTRIL) 20 MG tablet Take 2 tablets (40 mg total) by mouth daily. 90 tablet 0   rosuvastatin (CRESTOR) 10 MG tablet TAKE 1 TABLET(10 MG) BY MOUTH DAILY 90 tablet 0   No current facility-administered medications for this visit.    No Known Allergies  Family History  Problem Relation Age of Onset   Heart disease Mother    Stroke Father    Diabetes Father     Social History   Socioeconomic History   Marital status: Married    Spouse name: Not on file   Number of children: Not on file   Years of education: Not on file   Highest education level: Not on file  Occupational History   Not on file  Tobacco Use   Smoking status: Former   Smokeless tobacco: Current    Types: Snuff  Vaping Use   Vaping Use: Never used  Substance and Sexual Activity   Alcohol use: Not Currently   Drug use: Never   Sexual activity: Not on file   Other Topics Concern   Not on file  Social History Narrative   Not on file   Social Determinants of Health   Financial Resource Strain: Not on file  Food Insecurity: Not on file  Transportation Needs: Not on file  Physical Activity: Not on file  Stress: Not on file  Social Connections: Not on file  Intimate Partner Violence: Not on file     Constitutional: Denies fever, malaise, fatigue, headache or abrupt weight changes.  HEENT: Denies eye pain, eye redness, ear pain, ringing in the ears, wax buildup, runny nose, nasal congestion, bloody nose, or sore throat. Respiratory: Denies difficulty breathing, shortness of breath, cough or sputum production.   Cardiovascular: Denies chest pain, chest tightness, palpitations or swelling in the hands or feet.  Neurological: Denies dizziness, difficulty with memory, difficulty with speech or problems with balance and coordination.  Psych: Pt reports stress. Denies anxiety, depression, SI/HI.  No other specific complaints in a complete review of systems (except as listed in HPI above).  Objective:   Physical Exam  BP (!) 202/118 (BP Location: Right Arm, Patient Position: Sitting, Cuff  Size: Large)   Pulse 71   Temp 97.9 F (36.6 C) (Oral)   Wt 188 lb (85.3 kg)   SpO2 99%   BMI 33.84 kg/m   Wt Readings from Last 3 Encounters:  04/01/22 190 lb (86.2 kg)  10/02/21 199 lb (90.3 kg)  03/21/21 213 lb 12.8 oz (97 kg)    General: Appears her stated age, obese, in NAD. HEENT: Head: normal shape and size; Eyes: PERRLA and EOMs intact;  Cardiovascular: Normal rate and rhythm. S1,S2 noted.  No murmur, rubs or gallops noted. No JVD or BLE edema. No carotid bruits noted. Pulmonary/Chest: Normal effort and positive vesicular breath sounds. No respiratory distress. No wheezes, rales or ronchi noted.  Musculoskeletal: No difficulty with gait.  Neurological: Alert and oriented.   BMET    Component Value Date/Time   NA 144 04/01/2022 0937    K 3.7 04/01/2022 0937   CL 108 04/01/2022 0937   CO2 25 04/01/2022 0937   GLUCOSE 89 04/01/2022 0937   BUN 17 04/01/2022 0937   CREATININE 1.00 04/01/2022 0937   CALCIUM 9.3 04/01/2022 0937   GFRNONAA 53 (L) 03/21/2021 1054   GFRAA 62 03/21/2021 1054    Lipid Panel     Component Value Date/Time   CHOL 121 04/01/2022 0937   TRIG 149 04/01/2022 0937   HDL 50 04/01/2022 0937   CHOLHDL 2.4 04/01/2022 0937   LDLCALC 48 04/01/2022 0937    CBC    Component Value Date/Time   WBC 7.5 10/02/2021 0941   RBC 4.51 10/02/2021 0941   HGB 13.5 10/02/2021 0941   HCT 40.8 10/02/2021 0941   PLT 260 10/02/2021 0941   MCV 90.5 10/02/2021 0941   MCH 29.9 10/02/2021 0941   MCHC 33.1 10/02/2021 0941   RDW 12.2 10/02/2021 0941   LYMPHSABS 1,977 03/08/2020 0854   MONOABS 0.6 12/29/2006 1013   EOSABS 100 03/08/2020 0854   BASOSABS 30 03/08/2020 0854    Hgb A1C Lab Results  Component Value Date   HGBA1C 5.5 03/21/2021           Assessment & Plan:   Hypertensive Urgency:  Clonidine 0.1 mg PO x 1, repeat BP 184/102 Reinforced DASH diet and exercise for weight loss D/C Lisinopril RX for Amlodipine-Olmesartn 10-40 mg daily Indication for ECG: hypertensive urgency Interpretation for ECG: Sinus rhythm with right bundle branch block Comparison of ECG: None  RTC in 1 week follow up HTN Webb Silversmith, NP

## 2022-04-17 NOTE — Telephone Encounter (Signed)
Requested medication (s) are due for refill today: no  Requested medication (s) are on the active medication list: yes  Last refill:  yesterday #30/0  Future visit scheduled: yes  Notes to clinic:  pharmacy requesting 90 DS. Please advise     Requested Prescriptions  Pending Prescriptions Disp Refills   amLODipine-olmesartan (AZOR) 10-40 MG tablet [Pharmacy Med Name: AMLODIPINE/OLMES MEDOXOM 10-'40MG'$  T] 90 tablet     Sig: Take 1 tablet by mouth daily.     Cardiovascular: CCB + ARB Combos Failed - 04/16/2022  3:29 PM      Failed - Last BP in normal range    BP Readings from Last 1 Encounters:  04/16/22 (!) 184/102         Passed - K in normal range and within 180 days    Potassium  Date Value Ref Range Status  04/01/2022 3.7 3.5 - 5.3 mmol/L Final         Passed - Cr in normal range and within 180 days    Creat  Date Value Ref Range Status  04/01/2022 1.00 0.60 - 1.00 mg/dL Final         Passed - Na in normal range and within 180 days    Sodium  Date Value Ref Range Status  04/01/2022 144 135 - 146 mmol/L Final         Passed - Patient is not pregnant      Passed - Valid encounter within last 6 months    Recent Outpatient Visits           Yesterday Hypertensive urgency   [place], Coralie Keens, NP   2 weeks ago Hyperthyroidism   [facility] [place], Coralie Keens, NP   6 months ago Encounter for general adult medical examination with abnormal findings   [facility] [place], Coralie Keens, NP   1 year ago Anemia due to stage 3a chronic kidney disease [facility])   [facility] [place], Coralie Keens, NP   1 year ago Hypertension, unspecified type   [facility], Lupita Raider, FNP       Future Appointments             In 6 days [place], Coralie Keens, NP [facility], [facility]

## 2022-04-23 ENCOUNTER — Other Ambulatory Visit: Payer: Self-pay | Admitting: Internal Medicine

## 2022-04-23 ENCOUNTER — Encounter: Payer: Self-pay | Admitting: Internal Medicine

## 2022-04-23 ENCOUNTER — Ambulatory Visit (INDEPENDENT_AMBULATORY_CARE_PROVIDER_SITE_OTHER): Payer: Medicare HMO | Admitting: Internal Medicine

## 2022-04-23 DIAGNOSIS — E6609 Other obesity due to excess calories: Secondary | ICD-10-CM

## 2022-04-23 DIAGNOSIS — F419 Anxiety disorder, unspecified: Secondary | ICD-10-CM

## 2022-04-23 DIAGNOSIS — I10 Essential (primary) hypertension: Secondary | ICD-10-CM

## 2022-04-23 DIAGNOSIS — Z6833 Body mass index (BMI) 33.0-33.9, adult: Secondary | ICD-10-CM | POA: Diagnosis not present

## 2022-04-23 DIAGNOSIS — F32A Depression, unspecified: Secondary | ICD-10-CM | POA: Diagnosis not present

## 2022-04-23 MED ORDER — AMLODIPINE-OLMESARTAN 10-40 MG PO TABS: 1.0000 | ORAL_TABLET | Freq: Every day | ORAL | 1 refills | Status: DC

## 2022-04-23 MED ORDER — LABETALOL HCL 100 MG PO TABS: 100.0000 mg | ORAL_TABLET | Freq: Two times a day (BID) | ORAL | 0 refills | Status: DC

## 2022-04-23 MED ORDER — SERTRALINE HCL 50 MG PO TABS: ORAL_TABLET | ORAL | 0 refills | Status: DC

## 2022-04-23 NOTE — Assessment & Plan Note (Signed)
Encourage diet and exercise for weight loss 

## 2022-04-23 NOTE — Telephone Encounter (Signed)
Duplicate request. Requested Prescriptions  Pending Prescriptions Disp Refills  . labetalol (NORMODYNE) 100 MG tablet [Pharmacy Med Name: LABETALOL '100MG'$  TABLETS] 180 tablet     Sig: TAKE 1 TABLET(100 MG) BY MOUTH TWICE DAILY     Cardiovascular:  Beta Blockers Failed - 04/23/2022 11:36 AM      Failed - Last BP in normal range    BP Readings from Last 1 Encounters:  04/23/22 (!) 180/85         Passed - Last Heart Rate in normal range    Pulse Readings from Last 1 Encounters:  04/23/22 (!) 101         Passed - Valid encounter within last 6 months    Recent Outpatient Visits          Today Primary hypertension   [place], Coralie Keens, NP   1 week ago Hypertensive urgency   [facility] [place], Coralie Keens, NP   3 weeks ago Hyperthyroidism   [facility] [place], Coralie Keens, NP   6 months ago Encounter for general adult medical examination with abnormal findings   [facility], Coralie Keens, NP   1 year ago Anemia due to stage 3a chronic kidney disease ([place])   [place], Coralie Keens, NP      Future Appointments            In 2 weeks Garnette Gunner, Coralie Keens, NP [facility], [facility]

## 2022-04-23 NOTE — Assessment & Plan Note (Signed)
Improved but not at goal Continue amlodipine-olmesartan, refilled today Will add labetalol 100 mg twice daily Reinforced DASH diet and exercise for weight loss

## 2022-04-23 NOTE — Assessment & Plan Note (Signed)
Deteriorated Will trial sertraline Support offered

## 2022-04-23 NOTE — Patient Instructions (Signed)
Hypertension, Adult ?Hypertension is another name for high blood pressure. High blood pressure forces your heart to work harder to pump blood. This can cause problems over time. ?There are two numbers in a blood pressure reading. There is a top number (systolic) over a bottom number (diastolic). It is best to have a blood pressure that is below 120/80. ?What are the causes? ?The cause of this condition is not known. Some other conditions can lead to high blood pressure. ?What increases the risk? ?Some lifestyle factors can make you more likely to develop high blood pressure: ?Smoking. ?Not getting enough exercise or physical activity. ?Being overweight. ?Having too much fat, sugar, calories, or salt (sodium) in your diet. ?Drinking too much alcohol. ?Other risk factors include: ?Having any of these conditions: ?Heart disease. ?Diabetes. ?High cholesterol. ?Kidney disease. ?Obstructive sleep apnea. ?Having a family history of high blood pressure and high cholesterol. ?Age. The risk increases with age. ?Stress. ?What are the signs or symptoms? ?High blood pressure may not cause symptoms. Very high blood pressure (hypertensive crisis) may cause: ?Headache. ?Fast or uneven heartbeats (palpitations). ?Shortness of breath. ?Nosebleed. ?Vomiting or feeling like you may vomit (nauseous). ?Changes in how you see. ?Very bad chest pain. ?Feeling dizzy. ?Seizures. ?How is this treated? ?This condition is treated by making healthy lifestyle changes, such as: ?Eating healthy foods. ?Exercising more. ?Drinking less alcohol. ?Your doctor may prescribe medicine if lifestyle changes do not help enough and if: ?Your top number is above 130. ?Your bottom number is above 80. ?Your personal target blood pressure may vary. ?Follow these instructions at home: ?Eating and drinking ? ?If told, follow the DASH eating plan. To follow this plan: ?Fill one half of your plate at each meal with fruits and vegetables. ?Fill one fourth of your plate  at each meal with whole grains. Whole grains include whole-wheat pasta, brown rice, and whole-grain bread. ?Eat or drink low-fat dairy products, such as skim milk or low-fat yogurt. ?Fill one fourth of your plate at each meal with low-fat (lean) proteins. Low-fat proteins include fish, chicken without skin, eggs, beans, and tofu. ?Avoid fatty meat, cured and processed meat, or chicken with skin. ?Avoid pre-made or processed food. ?Limit the amount of salt in your diet to less than 1,500 mg each day. ?Do not drink alcohol if: ?Your doctor tells you not to drink. ?You are pregnant, may be pregnant, or are planning to become pregnant. ?If you drink alcohol: ?Limit how much you have to: ?0-1 drink a day for women. ?0-2 drinks a day for men. ?Know how much alcohol is in your drink. In the [place] one drink equals one 12 oz bottle of beer (355 mL), one 5 oz glass of wine (148 mL), or one 1? oz glass of hard liquor (44 mL). ?Lifestyle ? ?Work with your doctor to stay at a healthy weight or to lose weight. Ask your doctor what the best weight is for you. ?Get at least 30 minutes of exercise that causes your heart to beat faster (aerobic exercise) most days of the week. This may include walking, swimming, or biking. ?Get at least 30 minutes of exercise that strengthens your muscles (resistance exercise) at least 3 days a week. This may include lifting weights or doing Pilates. ?Do not smoke or use any products that contain nicotine or tobacco. If you need help quitting, ask your doctor. ?Check your blood pressure at home as told by your doctor. ?Keep all follow-up visits. ?Medicines ?Take over-the-counter and prescription medicines   only as told by your doctor. Follow directions carefully. ?Do not skip doses of blood pressure medicine. The medicine does not work as well if you skip doses. Skipping doses also puts you at risk for problems. ?Ask your doctor about side effects or reactions to medicines that you should watch  for. ?Contact a doctor if: ?You think you are having a reaction to the medicine you are taking. ?You have headaches that keep coming back. ?You feel dizzy. ?You have swelling in your ankles. ?You have trouble with your vision. ?Get help right away if: ?You get a very bad headache. ?You start to feel mixed up (confused). ?You feel weak or numb. ?You feel faint. ?You have very bad pain in your: ?Chest. ?Belly (abdomen). ?You vomit more than once. ?You have trouble breathing. ?These symptoms may be an emergency. Get help right away. Call 911. ?Do not wait to see if the symptoms will go away. ?Do not drive yourself to the hospital. ?Summary ?Hypertension is another name for high blood pressure. ?High blood pressure forces your heart to work harder to pump blood. ?For most people, a normal blood pressure is less than 120/80. ?Making healthy choices can help lower blood pressure. If your blood pressure does not get lower with healthy choices, you may need to take medicine. ?This information is not intended to replace advice given to you by your health care provider. Make sure you discuss any questions you have with your health care provider. ?Document Revised: 06/21/2021 Document Reviewed: 06/21/2021 ?Elsevier Patient Education ? 2023 [employer] ? ?

## 2022-04-23 NOTE — Progress Notes (Signed)
Subjective:    Patient ID: Leslie Randall, female    DOB: Jan 02, 1951, 71 y.o.   MRN: [id number]  HPI  Patient presents to clinic today for 2-week follow-up of HTN.  At her last visit, her medicine was changed from Lisinopril to Amlodipine-Olmesartan 10-'40mg'$   She has been taking medication as prescribed.  Her BP today is 180/85.  There is no ECG on file.  She would like to be started on something for her "nerves".  She is under a lot of stress, mainly with family situations.  She reports anxiety and depression.  She was on hydroxyzine in the past but she feels like she needs something daily instead of as needed.  She is not currently seeing a therapist.  She denies SI/HI.  Review of Systems     Past Medical History:  Diagnosis Date   Anemia    Brain tumor (benign) (HCC)    CAD (coronary artery disease)    Depression    Hyperlipidemia    Hypertension    Menopause    Osteoarthritis of both knees    Vitamin D deficiency     Current Outpatient Medications  Medication Sig Dispense Refill   amLODipine-olmesartan (AZOR) 10-40 MG tablet Take 1 tablet by mouth daily. 30 tablet 0   aspirin EC 81 MG tablet Take 1 tablet (81 mg total) by mouth daily. Swallow whole. 30 tablet 12   Biotin 1 MG CAPS Take by mouth.     diclofenac Sodium (VOLTAREN) 1 % GEL Apply 2 g topically 4 (four) times daily. 50 g 1   fexofenadine (ALLEGRA ALLERGY) 180 MG tablet Take 1 tablet (180 mg total) by mouth daily. 90 tablet 0   rosuvastatin (CRESTOR) 10 MG tablet TAKE 1 TABLET(10 MG) BY MOUTH DAILY 90 tablet 0   No current facility-administered medications for this visit.    No Known Allergies  Family History  Problem Relation Age of Onset   Heart disease Mother    Stroke Father    Diabetes Father     Social History   Socioeconomic History   Marital status: Married    Spouse name: Not on file   Number of children: Not on file   Years of education: Not on file   Highest education level: Not on file   Occupational History   Not on file  Tobacco Use   Smoking status: Former   Smokeless tobacco: Current    Types: Snuff  Vaping Use   Vaping Use: Never used  Substance and Sexual Activity   Alcohol use: Not Currently   Drug use: Never   Sexual activity: Not on file  Other Topics Concern   Not on file  Social History Narrative   Not on file   Social Determinants of Health   Financial Resource Strain: Not on file  Food Insecurity: Not on file  Transportation Needs: Not on file  Physical Activity: Not on file  Stress: Not on file  Social Connections: Not on file  Intimate Partner Violence: Not on file     Constitutional: Denies fever, malaise, fatigue, headache or abrupt weight changes.  Respiratory: Denies difficulty breathing, shortness of breath, cough or sputum production.   Cardiovascular: Denies chest pain, chest tightness, palpitations or swelling in the hands or feet.  Neurological: Denies dizziness, difficulty with memory, difficulty with speech or problems with balance and coordination.  Psych: Patient reports anxiety and depression.  Denies SI/HI.  No other specific complaints in a complete review of systems (  except as listed in HPI above).  Objective:   Physical Exam   BP (!) 180/85 (BP Location: Right Arm, Patient Position: Sitting, Cuff Size: Normal)   Pulse (!) 101   Temp 97.8 F (36.6 C) (Temporal)   Wt 188 lb (85.3 kg)   SpO2 98%   BMI 33.84 kg/m   Wt Readings from Last 3 Encounters:  04/16/22 188 lb (85.3 kg)  04/01/22 190 lb (86.2 kg)  10/02/21 199 lb (90.3 kg)    General: Appears her stated age, obese, in NAD. Cardiovascular: Tachycardic with normal rhythm. S1,S2 noted.  Murmur noted. No JVD or BLE edema. No carotid bruits noted. Pulmonary/Chest: Normal effort and positive vesicular breath sounds. No respiratory distress. No wheezes, rales or ronchi noted.  Neurological: Alert and oriented.  Psychiatric: Mood and affect normal. Behavior is  normal. Judgment and thought content normal.   BMET    Component Value Date/Time   NA 144 04/01/2022 0937   K 3.7 04/01/2022 0937   CL 108 04/01/2022 0937   CO2 25 04/01/2022 0937   GLUCOSE 89 04/01/2022 0937   BUN 17 04/01/2022 0937   CREATININE 1.00 04/01/2022 0937   CALCIUM 9.3 04/01/2022 0937   GFRNONAA 53 (L) 03/21/2021 1054   GFRAA 62 03/21/2021 1054    Lipid Panel     Component Value Date/Time   CHOL 121 04/01/2022 0937   TRIG 149 04/01/2022 0937   HDL 50 04/01/2022 0937   CHOLHDL 2.4 04/01/2022 0937   LDLCALC 48 04/01/2022 0937    CBC    Component Value Date/Time   WBC 7.5 10/02/2021 0941   RBC 4.51 10/02/2021 0941   HGB 13.5 10/02/2021 0941   HCT 40.8 10/02/2021 0941   PLT 260 10/02/2021 0941   MCV 90.5 10/02/2021 0941   MCH 29.9 10/02/2021 0941   MCHC 33.1 10/02/2021 0941   RDW 12.2 10/02/2021 0941   LYMPHSABS 1,977 03/08/2020 0854   MONOABS 0.6 12/29/2006 1013   EOSABS 100 03/08/2020 0854   BASOSABS 30 03/08/2020 0854    Hgb A1C Lab Results  Component Value Date   HGBA1C 5.5 03/21/2021           Assessment & Plan:    RTC in 2 weeks for follow-up of HTN 5 months for annual exam Webb Silversmith, NP

## 2022-05-07 ENCOUNTER — Ambulatory Visit (INDEPENDENT_AMBULATORY_CARE_PROVIDER_SITE_OTHER): Payer: Medicare HMO | Admitting: Internal Medicine

## 2022-05-07 ENCOUNTER — Encounter: Payer: Self-pay | Admitting: Internal Medicine

## 2022-05-07 VITALS — BP 119/63 | HR 86 | Temp 97.7°F | Wt 182.0 lb

## 2022-05-07 DIAGNOSIS — Z23 Encounter for immunization: Secondary | ICD-10-CM

## 2022-05-07 DIAGNOSIS — I10 Essential (primary) hypertension: Secondary | ICD-10-CM

## 2022-05-07 DIAGNOSIS — E6609 Other obesity due to excess calories: Secondary | ICD-10-CM | POA: Diagnosis not present

## 2022-05-07 DIAGNOSIS — Z6832 Body mass index (BMI) 32.0-32.9, adult: Secondary | ICD-10-CM

## 2022-05-07 DIAGNOSIS — S91012A Laceration without foreign body, left ankle, initial encounter: Secondary | ICD-10-CM

## 2022-05-07 MED ORDER — LABETALOL HCL 100 MG PO TABS: 100.0000 mg | ORAL_TABLET | Freq: Two times a day (BID) | ORAL | 0 refills | Status: DC

## 2022-05-07 MED ORDER — CEPHALEXIN 500 MG PO CAPS: 500.0000 mg | ORAL_CAPSULE | Freq: Three times a day (TID) | ORAL | 0 refills | Status: DC

## 2022-05-07 NOTE — Assessment & Plan Note (Signed)
Encourage diet and exercise for weight loss 

## 2022-05-07 NOTE — Addendum Note (Signed)
Addended by: Ashley Royalty E on: 05/07/2022 11:33 AM   Modules accepted: Orders

## 2022-05-07 NOTE — Patient Instructions (Signed)
Laceration Care, Adult A laceration is a cut that may go through all layers of the skin. The cut may also go into the tissue that is right under the skin. Some cuts heal on their own. Other cuts need to be closed with stitches (sutures), staples, skin adhesive strips, or skin glue. Taking care of your cut lowers your risk of infection, helps your injury heal better, and may prevent scarring. General tips Keep your wound clean and dry. Do not scratch or pick at your wound. Wash your hands with soap and water for at least 20 seconds before and after touching your wound or changing your bandage (dressing). If you cannot use soap and water, use hand sanitizer. Do not usedisinfectants or antiseptics, such as rubbing alcohol, to clean your wound unless told by your doctor. If you were given a bandage, change it at least once a day, or as told by your doctor. You should also change it if it gets wet or dirty. How to take care of your cut If your doctor used stitches or staples: Keep the wound fully dry for the first 24 hours, or as told by your doctor. After that, you may take a shower or a bath. Do not soak the wound in water until after the stitches or staples have been taken out. Clean the wound once a day, or as told by your doctor. To do this: Wash the wound with soap and water. Rinse the wound with water to remove all soap. Pat the wound dry with a clean towel. Do not rub the wound. After you clean the wound, put a thin layer of antibiotic ointment, another ointment, or a nonstick bandage on it as told by your doctor. This will help to: Prevent infection. Keep the bandage from sticking to the wound. Have your stitches or staples taken out as told by your doctor. If your doctor used skin adhesive strips: Do not get the skin adhesive strips wet. You can take a shower or a bath, but keep the wound dry. If the wound gets wet, pat it dry with a clean towel. Do not rub the wound. Skin adhesive strips  fall off on their own. You can trim the strips as the wound heals. Do not take off any strips that are still stuck to the wound unless told by your doctor. The strips will fall off after a while. If your doctor used skin glue: You may take a shower or a bath, but try to keep the wound dry. Do not soak the wound in water. After you take a shower or a bath, pat the wound dry with a clean towel. Do not rub the wound. Do not do any activities that will make you sweat a lot until the skin glue has fallen off. Do not apply liquid, cream, or ointment medicine to your wound while the skin glue is still on. If a bandage is placed over the wound, do not put tape right on top of the skin glue. Do not pick at the glue. The skin glue usually stays on for 5-10 days. Then, it falls off the skin. Follow these instructions at home: Medicines Take over-the-counter and prescription medicines only as told by your doctor. If you were prescribed an antibiotic medicine, take or apply it as told by your doctor. Do not stop using it even if you start to feel better. Managing pain and swelling If told, put ice on the injured area. To do this: Put ice in a   plastic bag. Place a towel between your skin and the bag. Leave the ice on for 20 minutes, 2-3 times a day. Take off the ice if your skin turns bright red. This is very important. If you cannot feel pain, heat, or cold, you have a greater risk of damage to the area. Raise the injured area above the level of your heart while you are sitting or lying down. General instructions  Avoid any activity that could make your wound reopen. Check your wound every day for signs of infection. Check for: More redness, swelling, or pain. Fluid or blood. Warmth. Pus or a bad smell. Keep all follow-up visits. Contact a doctor if: You got a tetanus shot and you have any of these problems where the needle went in: Swelling. Very bad pain. Redness. Bleeding. A wound that was  closed breaks open. You have a fever. You have any of these signs of infection in your wound: More redness, swelling, or pain. Fluid or blood. Warmth. Pus or a bad smell. You see something coming out of the wound, such as wood or glass. Medicine does not make your pain go away. You notice a change in the color of your skin near your wound. You need to change the bandage often. You have a new rash. You lose feeling (have numbness) around the wound. Get help right away if: You have very bad swelling around the wound. Your pain suddenly gets worse and is very bad. You have painful lumps near the wound or on skin anywhere on your body. You have a red streak going away from your wound. The wound is on your hand or foot, and: You cannot move a finger or toe. Your fingers or toes look pale or bluish. Summary A laceration is a cut that may go through all layers of the skin. The cut may also go into the tissue right under the skin. Some cuts heal on their own. Others need to be closed with stitches, staples, skin adhesive strips, or skin glue. Follow your doctor's instructions for caring for your cut. Proper care of a cut lowers the risk of infection, helps the cut heal better, and may prevent scarring. This information is not intended to replace advice given to you by your health care provider. Make sure you discuss any questions you have with your health care provider. Document Revised: 11/09/2020 Document Reviewed: 11/09/2020 [employer] Patient Education  2023 [employer]  

## 2022-05-07 NOTE — Progress Notes (Signed)
Subjective:    Patient ID: Leslie Randall, female    DOB: Mar 01, 1951, 71 y.o.   MRN: [id number]  HPI  Patient presents to clinic today for 2-week follow-up of HTN.  At her last visit she was started on Labetalol in addition to her Amlodipine-Olmesartan.  She has been taking this medication as prescribed and denies adverse side effects.  Her BP today is 119/63.  There is no ECG on file.  She also reports a wound to her left ankle. She reports she was lifting a trash bag when something in the trash bag cut her. This occurred 2 weeks ago. She has cleansed it with alcohol and been applying salve with some relief of symptoms.   Review of Systems     Past Medical History:  Diagnosis Date   Anemia    Brain tumor (benign) (HCC)    CAD (coronary artery disease)    Depression    Hyperlipidemia    Hypertension    Menopause    Osteoarthritis of both knees    Vitamin D deficiency     Current Outpatient Medications  Medication Sig Dispense Refill   amLODipine-olmesartan (AZOR) 10-40 MG tablet Take 1 tablet by mouth daily. 90 tablet 1   aspirin EC 81 MG tablet Take 1 tablet (81 mg total) by mouth daily. Swallow whole. 30 tablet 12   Biotin 1 MG CAPS Take by mouth.     diclofenac Sodium (VOLTAREN) 1 % GEL Apply 2 g topically 4 (four) times daily. 50 g 1   fexofenadine (ALLEGRA ALLERGY) 180 MG tablet Take 1 tablet (180 mg total) by mouth daily. 90 tablet 0   labetalol (NORMODYNE) 100 MG tablet Take 1 tablet (100 mg total) by mouth 2 (two) times daily. 60 tablet 0   rosuvastatin (CRESTOR) 10 MG tablet TAKE 1 TABLET(10 MG) BY MOUTH DAILY 90 tablet 0   sertraline (ZOLOFT) 50 MG tablet Take 0.5 tablets (25 mg total) by mouth daily for 10 days, THEN 1 tablet (50 mg total) daily. 85 tablet 0   No current facility-administered medications for this visit.    No Known Allergies  Family History  Problem Relation Age of Onset   Heart disease Mother    Stroke Father    Diabetes Father     Social  History   Socioeconomic History   Marital status: Married    Spouse name: Not on file   Number of children: Not on file   Years of education: Not on file   Highest education level: Not on file  Occupational History   Not on file  Tobacco Use   Smoking status: Former   Smokeless tobacco: Current    Types: Snuff  Vaping Use   Vaping Use: Never used  Substance and Sexual Activity   Alcohol use: Not Currently   Drug use: Never   Sexual activity: Not on file  Other Topics Concern   Not on file  Social History Narrative   Not on file   Social Determinants of Health   Financial Resource Strain: Not on file  Food Insecurity: Not on file  Transportation Needs: Not on file  Physical Activity: Not on file  Stress: Not on file  Social Connections: Not on file  Intimate Partner Violence: Not on file     Constitutional: Denies fever, malaise, fatigue, headache or abrupt weight changes.  Respiratory: Denies difficulty breathing, shortness of breath, cough or sputum production.   Cardiovascular: Denies chest pain, chest tightness, palpitations or  swelling in the hands or feet.  Skin: Patient reports wound to LLE.  Denies redness, rashes, lesions or ulcercations.  Neurological: Denies dizziness, difficulty with memory, difficulty with speech or problems with balance and coordination.    No other specific complaints in a complete review of systems (except as listed in HPI above).  Objective:   Physical Exam  BP 119/63 (BP Location: Left Arm, Patient Position: Sitting, Cuff Size: Normal)   Pulse 86   Temp 97.7 F (36.5 C) (Temporal)   Wt 182 lb (82.6 kg)   SpO2 99%   BMI 32.76 kg/m   Wt Readings from Last 3 Encounters:  04/23/22 188 lb (85.3 kg)  04/16/22 188 lb (85.3 kg)  04/01/22 190 lb (86.2 kg)    General: Appears her stated age, obese, in NAD. Skin: 4 cm linear gash noted to left medial ankle Cardiovascular: Normal rate and rhythm. S1,S2 noted.  No murmur, rubs or  gallops noted. No JVD or BLE edema.  Musculoskeletal: No difficulty with gait.  Neurological: Alert and oriented.      BMET    Component Value Date/Time   NA 144 04/01/2022 0937   K 3.7 04/01/2022 0937   CL 108 04/01/2022 0937   CO2 25 04/01/2022 0937   GLUCOSE 89 04/01/2022 0937   BUN 17 04/01/2022 0937   CREATININE 1.00 04/01/2022 0937   CALCIUM 9.3 04/01/2022 0937   GFRNONAA 53 (L) 03/21/2021 1054   GFRAA 62 03/21/2021 1054    Lipid Panel     Component Value Date/Time   CHOL 121 04/01/2022 0937   TRIG 149 04/01/2022 0937   HDL 50 04/01/2022 0937   CHOLHDL 2.4 04/01/2022 0937   LDLCALC 48 04/01/2022 0937    CBC    Component Value Date/Time   WBC 7.5 10/02/2021 0941   RBC 4.51 10/02/2021 0941   HGB 13.5 10/02/2021 0941   HCT 40.8 10/02/2021 0941   PLT 260 10/02/2021 0941   MCV 90.5 10/02/2021 0941   MCH 29.9 10/02/2021 0941   MCHC 33.1 10/02/2021 0941   RDW 12.2 10/02/2021 0941   LYMPHSABS 1,977 03/08/2020 0854   MONOABS 0.6 12/29/2006 1013   EOSABS 100 03/08/2020 0854   BASOSABS 30 03/08/2020 0854    Hgb A1C Lab Results  Component Value Date   HGBA1C 5.5 03/21/2021           Assessment & Plan:   Laceration to Left Ankle:  This needed sutures initially however we are outside the window for sutures Advised this will heal from the inside out Rx for Keflex 500 mg 3 times daily x10 days to prevent infection Cleanse with soap and water, cover with triple antibiotic ointment, nonadherent dressing and change daily Tdap today  RTC in 5 months for your annual exam Webb Silversmith, NP

## 2022-05-07 NOTE — Assessment & Plan Note (Signed)
Controlled on amlodipine-olmesartan and labetalol Reinforced DASH diet and exercise for weight loss

## 2022-05-21 ENCOUNTER — Other Ambulatory Visit: Payer: Self-pay | Admitting: Internal Medicine

## 2022-05-22 NOTE — Telephone Encounter (Signed)
Requested by interface surescripts . Requesting too soon. Requested Prescriptions  Refused Prescriptions Disp Refills  . amLODipine-olmesartan (AZOR) 10-40 MG tablet [Pharmacy Med Name: AMLODIPINE/OLMES MEDOXOM 10-'40MG'$  T] 90 tablet 1    Sig: TAKE 1 TABLET BY MOUTH DAILY     Cardiovascular: CCB + ARB Combos Passed - 05/21/2022 12:28 PM      Passed - K in normal range and within 180 days    Potassium  Date Value Ref Range Status  04/01/2022 3.7 3.5 - 5.3 mmol/L Final         Passed - Cr in normal range and within 180 days    Creat  Date Value Ref Range Status  04/01/2022 1.00 0.60 - 1.00 mg/dL Final         Passed - Na in normal range and within 180 days    Sodium  Date Value Ref Range Status  04/01/2022 144 135 - 146 mmol/L Final         Passed - Patient is not pregnant      Passed - Last BP in normal range    BP Readings from Last 1 Encounters:  05/07/22 119/63         Passed - Valid encounter within last 6 months    Recent Outpatient Visits          2 weeks ago Laceration of left ankle, initial encounter   [place], Coralie Keens, NP   4 weeks ago Primary hypertension   [place], Coralie Keens, NP   1 month ago Hypertensive urgency   [facility], Coralie Keens, NP   1 month ago Hyperthyroidism   [facility] [place], Coralie Keens, NP   7 months ago Encounter for general adult medical examination with abnormal findings   [facility] [place], Coralie Keens, NP      Future Appointments            In 4 months Baity, Coralie Keens, NP [facility], [facility]

## 2022-06-19 ENCOUNTER — Other Ambulatory Visit: Payer: Self-pay | Admitting: Internal Medicine

## 2022-06-19 DIAGNOSIS — E785 Hyperlipidemia, unspecified: Secondary | ICD-10-CM

## 2022-06-19 NOTE — Telephone Encounter (Signed)
Requested Prescriptions  Pending Prescriptions Disp Refills  . rosuvastatin (CRESTOR) 10 MG tablet [Pharmacy Med Name: ROSUVASTATIN '10MG'$  TABLETS] 90 tablet 2    Sig: TAKE 1 TABLET(10 MG) BY MOUTH DAILY     Cardiovascular:  Antilipid - Statins 2 Failed - 06/19/2022 11:38 AM      Failed - Lipid Panel in normal range within the last 12 months    Cholesterol  Date Value Ref Range Status  04/01/2022 121 <200 mg/dL Final   LDL Cholesterol (Calc)  Date Value Ref Range Status  04/01/2022 48 mg/dL (calc) Final    Comment:    Reference range: <100 . Desirable range <100 mg/dL for primary prevention;   <70 mg/dL for patients with CHD or diabetic patients  with > or = 2 CHD risk factors. Marland Kitchen LDL-C is now calculated using the Martin-Hopkins  calculation, which is a validated novel method providing  better accuracy than the Friedewald equation in the  estimation of LDL-C.  Cresenciano Genre et al. Annamaria Helling. 8088;110(31): 2061-2068  (http://education.[employer].com/faq/FAQ164)    HDL  Date Value Ref Range Status  04/01/2022 50 > OR = 50 mg/dL Final   Triglycerides  Date Value Ref Range Status  04/01/2022 149 <150 mg/dL Final         Passed - Cr in normal range and within 360 days    Creat  Date Value Ref Range Status  04/01/2022 1.00 0.60 - 1.00 mg/dL Final         Passed - Patient is not pregnant      Passed - Valid encounter within last 12 months    Recent Outpatient Visits          1 month ago Laceration of left ankle, initial encounter   [place], NP   1 month ago Primary hypertension   [facility] [place], Coralie Keens, NP   2 months ago Hypertensive urgency   [facility] [place], Coralie Keens, NP   2 months ago Hyperthyroidism   [facility] [place], Coralie Keens, NP   8 months ago Encounter for general adult medical examination with abnormal findings   [facility], Coralie Keens, NP       Future Appointments            In 3 months Baity, Coralie Keens, NP [facility], [facility]

## 2022-07-19 ENCOUNTER — Other Ambulatory Visit: Payer: Self-pay | Admitting: Internal Medicine

## 2022-07-19 NOTE — Telephone Encounter (Signed)
Requested Prescriptions  Pending Prescriptions Disp Refills   sertraline (ZOLOFT) 50 MG tablet [Pharmacy Med Name: SERTRALINE '50MG'$  TABLETS] 90 tablet 0    Sig: TAKE 1/2 TABLET(25 MG) BY MOUTH DAILY FOR 10 DAYS THEN TAKE 1 TABLET(50 MG) BY MOUTH DAILY     Psychiatry:  Antidepressants - SSRI - sertraline Passed - 07/19/2022  9:07 AM      Passed - AST in normal range and within 360 days    AST  Date Value Ref Range Status  04/01/2022 15 10 - 35 U/L Final         Passed - ALT in normal range and within 360 days    ALT  Date Value Ref Range Status  04/01/2022 10 6 - 29 U/L Final         Passed - Completed PHQ-2 or PHQ-9 in the last 360 days      Passed - Valid encounter within last 6 months    Recent Outpatient Visits           2 months ago Laceration of left ankle, initial encounter   [place], Coralie Keens, NP   2 months ago Primary hypertension   [place], Coralie Keens, NP   3 months ago Hypertensive urgency   [place], Coralie Keens, NP   3 months ago Hyperthyroidism   [facility] [place], Coralie Keens, NP   9 months ago Encounter for general adult medical examination with abnormal findings   [facility] [place], Coralie Keens, NP       Future Appointments             In 2 months Baity, Coralie Keens, NP [facility], [facility]

## 2022-07-29 ENCOUNTER — Ambulatory Visit (INDEPENDENT_AMBULATORY_CARE_PROVIDER_SITE_OTHER): Payer: Medicare HMO

## 2022-07-29 VITALS — BP 138/70 | HR 81 | Temp 98.3°F | Resp 18 | Ht 62.25 in | Wt 178.8 lb

## 2022-07-29 DIAGNOSIS — Z0000 Encounter for general adult medical examination without abnormal findings: Secondary | ICD-10-CM

## 2022-07-29 DIAGNOSIS — Z23 Encounter for immunization: Secondary | ICD-10-CM | POA: Diagnosis not present

## 2022-07-29 NOTE — Patient Instructions (Signed)

## 2022-07-29 NOTE — Progress Notes (Addendum)
Subjective:   Leslie Randall is a 71 y.o. female who presents for Medicare Annual (Subsequent) preventive examination.  Review of Systems    Per HPI unless specifically indicated below.  Cardiac Risk Factors include: advanced age (>90mn, >>78women);female gender        Objective:    Today's Vitals   07/29/22 1420  BP: 138/70  Pulse: 81  Resp: 18  Temp: 98.3 F (36.8 C)  TempSrc: Oral  SpO2: 100%  Weight: 178 lb 12.8 oz (81.1 kg)  Height: 5' 2.25" (1.581 m)   Body mass index is 32.44 kg/m.     07/29/2022    2:53 PM  Advanced Directives  Does Patient Have a Medical Advance Directive? No  Would patient like information on creating a medical advance directive? No - Patient declined    Current Medications (verified) Outpatient Encounter Medications as of 07/29/2022  Medication Sig   amLODipine-olmesartan (AZOR) 10-40 MG tablet Take 1 tablet by mouth daily.   aspirin EC 81 MG tablet Take 1 tablet (81 mg total) by mouth daily. Swallow whole.   diclofenac Sodium (VOLTAREN) 1 % GEL Apply 2 g topically 4 (four) times daily.   labetalol (NORMODYNE) 100 MG tablet Take 1 tablet (100 mg total) by mouth 2 (two) times daily.   rosuvastatin (CRESTOR) 10 MG tablet TAKE 1 TABLET(10 MG) BY MOUTH DAILY   sertraline (ZOLOFT) 50 MG tablet TAKE 1/2 TABLET(25 MG) BY MOUTH DAILY FOR 10 DAYS THEN TAKE 1 TABLET(50 MG) BY MOUTH DAILY   Biotin 1 MG CAPS Take by mouth. (Patient not taking: Reported on 07/29/2022)   cephALEXin (KEFLEX) 500 MG capsule Take 1 capsule (500 mg total) by mouth 3 (three) times daily. (Patient not taking: Reported on 07/29/2022)   fexofenadine (ALLEGRA ALLERGY) 180 MG tablet Take 1 tablet (180 mg total) by mouth daily. (Patient not taking: Reported on 07/29/2022)   No facility-administered encounter medications on file as of 07/29/2022.    Allergies (verified) Patient has no known allergies.   History: Past Medical History:  Diagnosis Date   Anemia    Brain  tumor (benign) (HCC)    CAD (coronary artery disease)    Depression    Hyperlipidemia    Hypertension    Menopause    Osteoarthritis of both knees    Vitamin D deficiency    Past Surgical History:  Procedure Laterality Date   ABDOMINAL HYSTERECTOMY     Family History  Problem Relation Age of Onset   Heart disease Mother    Stroke Father    Diabetes Father    Social History   Socioeconomic History   Marital status: Widowed    Spouse name: Not on file   Number of children: 3   Years of education: Not on file   Highest education level: Not on file  Occupational History   Not on file  Tobacco Use   Smoking status: Former   Smokeless tobacco: Current    Types: Snuff  Vaping Use   Vaping Use: Never used  Substance and Sexual Activity   Alcohol use: Not Currently   Drug use: Never   Sexual activity: Not on file  Other Topics Concern   Not on file  Social History Narrative   Not on file   Social Determinants of Health   Financial Resource Strain: Low Risk  (07/29/2022)   Overall Financial Resource Strain (CARDIA)    Difficulty of Paying Living Expenses: Not hard at all  Food  Insecurity: No Food Insecurity (07/29/2022)   Hunger Vital Sign    Worried About Running Out of Food in the Last Year: Never true    Ran Out of Food in the Last Year: Never true  Transportation Needs: No Transportation Needs (07/29/2022)   PRAPARE - Hydrologist (Medical): No    Lack of Transportation (Non-Medical): No  Physical Activity: Inactive (07/29/2022)   Exercise Vital Sign    Days of Exercise per Week: 0 days    Minutes of Exercise per Session: 0 min  Stress: Stress Concern Present (07/29/2022)   [place]    Feeling of Stress : To some extent  Social Connections: Socially Isolated (07/29/2022)   Social Connection and Isolation Panel [NHANES]    Frequency of Communication with Friends  and Family: More than three times a week    Frequency of Social Gatherings with Friends and Family: More than three times a week    Attends Religious Services: Never    Marine scientist or Organizations: No    Attends Archivist Meetings: Not on file    Marital Status: Widowed    Tobacco Counseling Ready to quit: No Counseling given: Yes   Clinical Intake:  Pre-visit preparation completed: No  Pain : No/denies pain     Nutritional Status: BMI > 30  Obese Nutritional Risks: None Diabetes: No  How often do you need to have someone help you when you read instructions, pamphlets, or other written materials from your doctor or pharmacy?: 1 - Never  Diabetic?No   Interpreter Needed?: No  Information entered by :: Donnie Mesa, cMA   Activities of Daily Living    07/29/2022    2:32 PM 04/23/2022   11:12 AM  In your present state of health, do you have any difficulty performing the following activities:  Hearing? 0 0  Vision? 1 0  Difficulty concentrating or making decisions? 1 0  Walking or climbing stairs? 1 0  Dressing or bathing? 0 0  Doing errands, shopping? 0 0    Patient Care Team: Jearld Fenton, NP as PCP - General (Internal Medicine)  Indicate any recent Medical Services you may have received from other than [facility] providers in the past year (date may be approximate).    No hospitalization in the last 12 months.  Assessment:   This is a routine wellness examination for Star.  Hearing/Vision screen Denies any hearing issues. Vision issues. Overdue for eye exam, unsure when her last eye exam was. The pt state that she will call and schedule an appt .  Dietary issues and exercise activities discussed: Current Exercise Habits: The patient has a physically strenuous job, but has no regular exercise apart from work., Exercise limited by: orthopedic condition(s)   Goals Addressed             This Visit's Progress    Stay Active and  Independent           Why is this important?   Regular activity or exercise is important to managing back pain.  Activity helps to keep your muscles strong.  You will sleep better and feel more relaxed.  You will have more energy and feel less stressed.  If you are not active now, start slowly. Little changes make a big difference.  Rest, but not too much.  Stay as active as you can and listen to your body's signals.  Depression Screen    04/23/2022   11:12 AM 10/02/2021    8:58 AM 06/08/2020    3:28 PM  PHQ 2/9 Scores  PHQ - 2 Score 0 0 1  PHQ- 9 Score '2 3 3    '$ Fall Risk    07/29/2022    2:31 PM 04/23/2022   11:12 AM 10/02/2021    8:59 AM 06/08/2020    3:02 PM  Fall Risk   Falls in the past year? 1 0 1 0  Number falls in past yr: 0 0 1 0  Injury with Fall? 1 0 1   Comment   swollen right knee   Risk for fall due to : Impaired mobility No Fall Risks History of fall(s) No Fall Risks  Follow up Falls evaluation completed Falls evaluation completed Falls evaluation completed Falls evaluation completed    [place]:  Any stairs in or around the home? Yes If so, are there any without handrails? No  Home free of loose throw rugs in walkways, pet beds, electrical cords, etc? Yes  Adequate lighting in your home to reduce risk of falls? Yes   ASSISTIVE DEVICES UTILIZED TO PREVENT FALLS:  Life alert? No  Use of a cane, walker or w/c? No  Grab bars in the bathroom? No  Shower chair or bench in shower? No  Elevated toilet seat or a handicapped toilet? No   TIMED UP AND GO:  Was the test performed? Yes .  Length of time to ambulate 10 feet: 10  sec.   Gait steady and fast without use of assistive device  Cognitive Function:  Trouble with short-term memory.        07/29/2022    2:33 PM  6CIT Screen  What Year? 0 points  What month? 0 points  What time? 0 points  Count back from 20 0 points  Months in reverse 0 points   Repeat phrase 10 points  Total Score 10 points    Immunizations Immunization History  Administered Date(s) Administered   Fluad Quad(high Dose 65+) 10/02/2021, 07/29/2022   Janssen (J&J) SARS-COV-2 Vaccination 01/31/2020   PNEUMOCOCCAL CONJUGATE-20 10/02/2021   Tdap 05/07/2022    TDAP status: Up to date  Flu Vaccine status: Completed at today's visit  Pneumococcal vaccine status: Up to date  Covid-19 vaccine status: Information provided on how to obtain vaccines.   Qualifies for Shingles Vaccine? Yes   Zostavax completed No   Shingrix Completed?: No.    Education has been provided regarding the importance of this vaccine. Patient has been advised to call insurance company to determine out of pocket expense if they have not yet received this vaccine. Advised may also receive vaccine at local pharmacy or Health Dept. Verbalized acceptance and understanding.  Screening Tests Health Maintenance  Topic Date Due   COVID-19 Vaccine (2 - Booster for Janssen series) 03/27/2020   MAMMOGRAM  10/02/2022 (Originally 04/26/1999)   DEXA SCAN  10/02/2022 (Originally 04/25/2014)   COLONOSCOPY (Pts 45-4yr Insurance coverage will need to be confirmed)  10/02/2022 (Originally 04/25/1994)   Zoster Vaccines- Shingrix (1 of 2) 10/29/2022 (Originally 04/26/1999)   Medicare Annual Wellness (AWV)  07/30/2023   TETANUS/TDAP  05/07/2032   Pneumonia Vaccine 71 Years old  Completed   INFLUENZA VACCINE  Completed   Hepatitis C Screening  Completed   HPV VACCINES  Aged Out    Health Maintenance  Health Maintenance Due  Topic Date Due   COVID-19 Vaccine (2 -  Booster for [employer] series) 03/27/2020    Colorectal cancer screening: No longer required.   Mammogram status: No longer required due to age.    Lung Cancer Screening: (Low Dose CT Chest recommended if Age 65-80 years, 30 pack-year currently smoking OR have quit w/in 15years.) does not qualify.   Lung Cancer Screening Referral: no    Additional Screening:  Hepatitis C Screening: does qualify; Completed 10/02/2021  Vision Screening: Recommended annual ophthalmology exams for early detection of glaucoma and other disorders of the eye. Is the patient up to date with their annual eye exam?  No  Who is the provider or what is the name of the office in which the patient attends annual eye exams?  If pt is not established with a provider, would they like to be referred to a provider to establish care? No .   Dental Screening: Recommended annual dental exams for proper oral hygiene  Community Resource Referral / Chronic Care Management: CRR required this visit?  No   CCM required this visit?  No      Plan:     I have personally reviewed and noted the following in the patient's chart:   Medical and social history Use of alcohol, tobacco or illicit drugs  Current medications and supplements including opioid prescriptions. Patient is not currently taking opioid prescriptions. Functional ability and status Nutritional status Physical activity Advanced directives List of other physicians Hospitalizations, surgeries, and ER visits in previous 12 months Vitals Screenings to include cognitive, depression, and falls Referrals and appointments  In addition, I have reviewed and discussed with patient certain preventive protocols, quality metrics, and best practice recommendations. A written personalized care plan for preventive services as well as general preventive health recommendations were provided to patient.     Wilson Singer, CMA   07/29/2022   Nurse Notes: Approximately 30 minute Face-To-Face

## 2022-08-19 ENCOUNTER — Other Ambulatory Visit: Payer: Self-pay

## 2022-08-19 DIAGNOSIS — Z1231 Encounter for screening mammogram for malignant neoplasm of breast: Secondary | ICD-10-CM

## 2022-08-19 NOTE — Progress Notes (Signed)
Pt was seen on 07/30/23 for Medicare Wellness visit and decided today that she would like to proceed with a Mammogram.

## 2022-09-04 ENCOUNTER — Other Ambulatory Visit: Payer: Self-pay | Admitting: Internal Medicine

## 2022-09-05 NOTE — Telephone Encounter (Signed)
Requested Prescriptions  Pending Prescriptions Disp Refills   labetalol (NORMODYNE) 100 MG tablet [Pharmacy Med Name: LABETALOL '100MG'$  TABLETS] 180 tablet 0    Sig: TAKE 1 TABLET(100 MG) BY MOUTH TWICE DAILY     Cardiovascular:  Beta Blockers Passed - 09/04/2022 10:31 AM      Passed - Last BP in normal range    BP Readings from Last 1 Encounters:  07/29/22 138/70         Passed - Last Heart Rate in normal range    Pulse Readings from Last 1 Encounters:  07/29/22 81         Passed - Valid encounter within last 6 months    Recent Outpatient Visits           4 months ago Laceration of left ankle, initial encounter   [place], Coralie Keens, NP   4 months ago Primary hypertension   [place], Coralie Keens, NP   4 months ago Hypertensive urgency   [place], Coralie Keens, NP   5 months ago Hyperthyroidism   [facility] [place], Coralie Keens, NP   11 months ago Encounter for general adult medical examination with abnormal findings   [facility] [place], Coralie Keens, NP       Future Appointments             In 1 month Baity, Coralie Keens, NP [facility], [facility]

## 2022-09-18 ENCOUNTER — Other Ambulatory Visit: Payer: Self-pay | Admitting: Internal Medicine

## 2022-09-19 NOTE — Telephone Encounter (Signed)
Refilled 04/23/2022 - 6 month supply. Requested Prescriptions  Pending Prescriptions Disp Refills   amLODipine-olmesartan (AZOR) 10-40 MG tablet [Pharmacy Med Name: AMLODIPINE/OLMES MEDOXOM 10-'40MG'$  T] 90 tablet 1    Sig: TAKE 1 TABLET BY MOUTH DAILY     Cardiovascular: CCB + ARB Combos Passed - 09/18/2022  1:02 PM      Passed - K in normal range and within 180 days    Potassium  Date Value Ref Range Status  04/01/2022 3.7 3.5 - 5.3 mmol/L Final         Passed - Cr in normal range and within 180 days    Creat  Date Value Ref Range Status  04/01/2022 1.00 0.60 - 1.00 mg/dL Final         Passed - Na in normal range and within 180 days    Sodium  Date Value Ref Range Status  04/01/2022 144 135 - 146 mmol/L Final         Passed - Patient is not pregnant      Passed - Last BP in normal range    BP Readings from Last 1 Encounters:  07/29/22 138/70         Passed - Valid encounter within last 6 months    Recent Outpatient Visits           4 months ago Laceration of left ankle, initial encounter   [place], Coralie Keens, NP   4 months ago Primary hypertension   [place], Coralie Keens, NP   5 months ago Hypertensive urgency   [place], Coralie Keens, NP   5 months ago Hyperthyroidism   [facility] [place], Coralie Keens, NP   11 months ago Encounter for general adult medical examination with abnormal findings   [facility], Coralie Keens, NP       Future Appointments             In 2 weeks [place], Coralie Keens, NP [facility]

## 2022-10-08 ENCOUNTER — Encounter: Payer: Medicare HMO | Admitting: Internal Medicine

## 2022-10-08 NOTE — Progress Notes (Deleted)
Subjective:    Patient ID: Leslie Randall, female    DOB: Jan 24, 1951, 72 y.o.   MRN: [id number]  HPI  Patient presents to clinic today for her annual exam.  Flu: 07/2022 Tetanus: 04/2022 COVID: Alphonsa Overall x 1 Pneumovax: Never Prevnar 20: 09/2021 Shingrix: Never Pap smear: Hysterectomy Mammogram: Bone density: Colon screening: Vision screening: Dentist:  Diet: Exercise:  Review of Systems     Past Medical History:  Diagnosis Date   Anemia    Brain tumor (benign) ([place])    CAD (coronary artery disease)    Depression    Hyperlipidemia    Hypertension    Menopause    Osteoarthritis of both knees    Vitamin D deficiency     Current Outpatient Medications  Medication Sig Dispense Refill   amLODipine-olmesartan (AZOR) 10-40 MG tablet Take 1 tablet by mouth daily. 90 tablet 1   aspirin EC 81 MG tablet Take 1 tablet (81 mg total) by mouth daily. Swallow whole. 30 tablet 12   Biotin 1 MG CAPS Take by mouth. (Patient not taking: Reported on 07/29/2022)     cephALEXin (KEFLEX) 500 MG capsule Take 1 capsule (500 mg total) by mouth 3 (three) times daily. (Patient not taking: Reported on 07/29/2022) 30 capsule 0   diclofenac Sodium (VOLTAREN) 1 % GEL Apply 2 g topically 4 (four) times daily. 50 g 1   fexofenadine (ALLEGRA ALLERGY) 180 MG tablet Take 1 tablet (180 mg total) by mouth daily. (Patient not taking: Reported on 07/29/2022) 90 tablet 0   labetalol (NORMODYNE) 100 MG tablet TAKE 1 TABLET(100 MG) BY MOUTH TWICE DAILY 180 tablet 0   rosuvastatin (CRESTOR) 10 MG tablet TAKE 1 TABLET(10 MG) BY MOUTH DAILY 90 tablet 2   sertraline (ZOLOFT) 50 MG tablet TAKE 1/2 TABLET(25 MG) BY MOUTH DAILY FOR 10 DAYS THEN TAKE 1 TABLET(50 MG) BY MOUTH DAILY 90 tablet 0   No current facility-administered medications for this visit.    No Known Allergies  Family History  Problem Relation Age of Onset   Heart disease Mother    Stroke Father    Diabetes Father     Social History    Socioeconomic History   Marital status: Widowed    Spouse name: Not on file   Number of children: 3   Years of education: Not on file   Highest education level: Not on file  Occupational History   Not on file  Tobacco Use   Smoking status: Former   Smokeless tobacco: Current    Types: Snuff  Vaping Use   Vaping Use: Never used  Substance and Sexual Activity   Alcohol use: Not Currently   Drug use: Never   Sexual activity: Not on file  Other Topics Concern   Not on file  Social History Narrative   Not on file   Social Determinants of Health   Financial Resource Strain: Low Risk  (07/29/2022)   Overall Financial Resource Strain (CARDIA)    Difficulty of Paying Living Expenses: Not hard at all  Food Insecurity: No Food Insecurity (07/29/2022)   Hunger Vital Sign    Worried About Running Out of Food in the Last Year: Never true    [place] in the Last Year: Never true  Transportation Needs: No Transportation Needs (07/29/2022)   PRAPARE - Hydrologist (Medical): No    Lack of Transportation (Non-Medical): No  Physical Activity: Inactive (07/29/2022)   Exercise Vital Sign  Days of Exercise per Week: 0 days    Minutes of Exercise per Session: 0 min  Stress: Stress Concern Present (07/29/2022)   [place]    Feeling of Stress : To some extent  Social Connections: Socially Isolated (07/29/2022)   Social Connection and Isolation Panel [NHANES]    Frequency of Communication with Friends and Family: More than three times a week    Frequency of Social Gatherings with Friends and Family: More than three times a week    Attends Religious Services: Never    Marine scientist or Organizations: No    Attends Archivist Meetings: Not on file    Marital Status: Widowed  Intimate Partner Violence: Not At Risk (07/29/2022)   Humiliation, Afraid, Rape, and Kick  questionnaire    Fear of Current or Ex-Partner: No    Emotionally Abused: No    Physically Abused: No    Sexually Abused: No     Constitutional: Denies fever, malaise, fatigue, headache or abrupt weight changes.  HEENT: Denies eye pain, eye redness, ear pain, ringing in the ears, wax buildup, runny nose, nasal congestion, bloody nose, or sore throat. Respiratory: Denies difficulty breathing, shortness of breath, cough or sputum production.   Cardiovascular: Denies chest pain, chest tightness, palpitations or swelling in the hands or feet.  Gastrointestinal: Denies abdominal pain, bloating, constipation, diarrhea or blood in the stool.  GU: Denies urgency, frequency, pain with urination, burning sensation, blood in urine, odor or discharge. Musculoskeletal: Patient reports joint pain in knees.  Denies decrease in range of motion, difficulty with gait, muscle pain or joint swelling.  Skin: Denies redness, rashes, lesions or ulcercations.  Neurological: Denies dizziness, difficulty with memory, difficulty with speech or problems with balance and coordination.  Psych: Patient has a history of anxiety and depression.  Denies SI/HI.  No other specific complaints in a complete review of systems (except as listed in HPI above).  Objective:   Physical Exam   There were no vitals taken for this visit. Wt Readings from Last 3 Encounters:  07/29/22 178 lb 12.8 oz (81.1 kg)  05/07/22 182 lb (82.6 kg)  04/23/22 188 lb (85.3 kg)    General: Appears their stated age, well developed, well nourished in NAD. Skin: Warm, dry and intact. No rashes, lesions or ulcerations noted. HEENT: Head: normal shape and size; Eyes: sclera white, no icterus, conjunctiva pink, PERRLA and EOMs intact; Ears: Tm's gray and intact, normal light reflex; Nose: mucosa pink and moist, septum midline; Throat/Mouth: Teeth present, mucosa pink and moist, no exudate, lesions or ulcerations noted.  Neck:  Neck supple, trachea  midline. No masses, lumps or thyromegaly present.  Cardiovascular: Normal rate and rhythm. S1,S2 noted.  No murmur, rubs or gallops noted. No JVD or BLE edema. No carotid bruits noted. Pulmonary/Chest: Normal effort and positive vesicular breath sounds. No respiratory distress. No wheezes, rales or ronchi noted.  Abdomen: Soft and nontender. Normal bowel sounds. No distention or masses noted. Liver, spleen and kidneys non palpable. Musculoskeletal: Normal range of motion. No signs of joint swelling. No difficulty with gait.  Neurological: Alert and oriented. Cranial nerves II-XII grossly intact. Coordination normal.  Psychiatric: Mood and affect normal. Behavior is normal. Judgment and thought content normal.    BMET    Component Value Date/Time   NA 144 04/01/2022 0937   K 3.7 04/01/2022 0937   CL 108 04/01/2022 0937   CO2 25 04/01/2022 [id number]  GLUCOSE 89 04/01/2022 0937   BUN 17 04/01/2022 0937   CREATININE 1.00 04/01/2022 0937   CALCIUM 9.3 04/01/2022 0937   GFRNONAA 53 (L) 03/21/2021 1054   GFRAA 62 03/21/2021 1054    Lipid Panel     Component Value Date/Time   CHOL 121 04/01/2022 0937   TRIG 149 04/01/2022 0937   HDL 50 04/01/2022 0937   CHOLHDL 2.4 04/01/2022 0937   LDLCALC 48 04/01/2022 0937    CBC    Component Value Date/Time   WBC 7.5 10/02/2021 0941   RBC 4.51 10/02/2021 0941   HGB 13.5 10/02/2021 0941   HCT 40.8 10/02/2021 0941   PLT 260 10/02/2021 0941   MCV 90.5 10/02/2021 0941   MCH 29.9 10/02/2021 0941   MCHC 33.1 10/02/2021 0941   RDW 12.2 10/02/2021 0941   LYMPHSABS 1,977 03/08/2020 0854   MONOABS 0.6 12/29/2006 1013   EOSABS 100 03/08/2020 0854   BASOSABS 30 03/08/2020 0854    Hgb A1C Lab Results  Component Value Date   HGBA1C 5.5 03/21/2021           Assessment & Plan:   Preventative Health Maintenance:  Flu shot UTD Tetanus UTD Pneumonia vaccines UTD Encouraged her to get her COVID booster Discussed Shingrix vaccine, she will  check coverage with her insurance company and schedule a visit at the pharmacy if she would like to have this done She no longer needs to screen for cervical cancer Mammogram and bone density ordered-she will call to schedule She declines colon cancer screening Encouraged her to consume a balanced diet and exercise regimen Advised her to see an eye doctor and dentist annually We will check CBC, c-Met, lipid and A1c today  RTC in 6 months, follow-up chronic conditions Webb Silversmith, NP

## 2022-11-19 ENCOUNTER — Other Ambulatory Visit: Payer: Self-pay | Admitting: Internal Medicine

## 2022-11-19 NOTE — Telephone Encounter (Signed)
Requested Prescriptions  Pending Prescriptions Disp Refills   amLODipine-olmesartan (AZOR) 10-40 MG tablet [Pharmacy Med Name: AMLODIPINE/OLMES MEDOXOM 10-'40MG'$  T] 90 tablet 0    Sig: TAKE 1 TABLET BY MOUTH DAILY     Cardiovascular: CCB + ARB Combos Failed - 11/19/2022 10:15 AM      Failed - K in normal range and within 180 days    Potassium  Date Value Ref Range Status  04/01/2022 3.7 3.5 - 5.3 mmol/L Final         Failed - Cr in normal range and within 180 days    Creat  Date Value Ref Range Status  04/01/2022 1.00 0.60 - 1.00 mg/dL Final         Failed - Na in normal range and within 180 days    Sodium  Date Value Ref Range Status  04/01/2022 144 135 - 146 mmol/L Final         Passed - Patient is not pregnant      Passed - Last BP in normal range    BP Readings from Last 1 Encounters:  07/29/22 138/70         Passed - Valid encounter within last 6 months    Recent Outpatient Visits           6 months ago Laceration of left ankle, initial encounter   [place], Coralie Keens, NP   7 months ago Primary hypertension   [facility] [place], Coralie Keens, NP   7 months ago Hypertensive urgency   [facility] [place], Coralie Keens, NP   7 months ago Hyperthyroidism   [facility] [place], Coralie Keens, NP   1 year ago Encounter for general adult medical examination with abnormal findings   [facility] [place], Coralie Keens, NP

## 2023-03-14 ENCOUNTER — Other Ambulatory Visit: Payer: Self-pay | Admitting: Internal Medicine

## 2023-03-14 DIAGNOSIS — E785 Hyperlipidemia, unspecified: Secondary | ICD-10-CM

## 2023-03-17 NOTE — Telephone Encounter (Signed)
Requested Prescriptions  Pending Prescriptions Disp Refills   rosuvastatin (CRESTOR) 10 MG tablet [Pharmacy Med Name: ROSUVASTATIN 10MG  TABLETS] 30 tablet 0    Sig: TAKE 1 TABLET(10 MG) BY MOUTH DAILY     Cardiovascular:  Antilipid - Statins 2 Failed - 03/14/2023  7:05 AM      Failed - Lipid Panel in normal range within the last 12 months    Cholesterol  Date Value Ref Range Status  04/01/2022 121 <200 mg/dL Final   LDL Cholesterol (Calc)  Date Value Ref Range Status  04/01/2022 48 mg/dL (calc) Final    Comment:    Reference range: <100 . Desirable range <100 mg/dL for primary prevention;   <70 mg/dL for patients with CHD or diabetic patients  with > or = 2 CHD risk factors. Marland Kitchen LDL-C is now calculated using the Martin-Hopkins  calculation, which is a validated novel method providing  better accuracy than the Friedewald equation in the  estimation of LDL-C.  Horald Pollen et al. Lenox Ahr. 5409;811(91): 2061-2068  (http://education.[facility].com/faq/FAQ164)    HDL  Date Value Ref Range Status  04/01/2022 50 > OR = 50 mg/dL Final   Triglycerides  Date Value Ref Range Status  04/01/2022 149 <150 mg/dL Final         Passed - Cr in normal range and within 360 days    Creat  Date Value Ref Range Status  04/01/2022 1.00 0.60 - 1.00 mg/dL Final         Passed - Patient is not pregnant      Passed - Valid encounter within last 12 months    Recent Outpatient Visits           10 months ago Laceration of left ankle, initial encounter   [place] [facility] [place], Salvadore Oxford, NP   10 months ago Primary hypertension   [place] [facility] [place], Salvadore Oxford, NP   11 months ago Hypertensive urgency   [facility] [place], Salvadore Oxford, NP   11 months ago Hyperthyroidism    [facility] [place], Salvadore Oxford, NP   1 year ago Encounter for general adult medical examination with abnormal findings   [facility] [place], Salvadore Oxford, NP

## 2023-03-21 ENCOUNTER — Other Ambulatory Visit: Payer: Self-pay | Admitting: Internal Medicine

## 2023-03-21 NOTE — Telephone Encounter (Signed)
Requested medication (s) are due for refill today: Yes  Requested medication (s) are on the active medication list: Yes  Last refill:  11/19/22  Future visit scheduled: No  Notes to clinic:  Appointment needed.     Requested Prescriptions  Pending Prescriptions Disp Refills   amLODipine-olmesartan (AZOR) 10-40 MG tablet [Pharmacy Med Name: AMLODIPINE/OLMES MEDOXOM 10-40MG  T] 90 tablet 0    Sig: TAKE 1 TABLET BY MOUTH DAILY     Cardiovascular: CCB + ARB Combos Failed - 03/21/2023 12:48 PM      Failed - K in normal range and within 180 days    Potassium  Date Value Ref Range Status  04/01/2022 3.7 3.5 - 5.3 mmol/L Final         Failed - Cr in normal range and within 180 days    Creat  Date Value Ref Range Status  04/01/2022 1.00 0.60 - 1.00 mg/dL Final         Failed - Na in normal range and within 180 days    Sodium  Date Value Ref Range Status  04/01/2022 144 135 - 146 mmol/L Final         Failed - Valid encounter within last 6 months    Recent Outpatient Visits           10 months ago Laceration of left ankle, initial encounter   [place] [facility] [place], Salvadore Oxford, NP   11 months ago Primary hypertension   [facility] [place], Salvadore Oxford, NP   11 months ago Hypertensive urgency   [place] [facility] [place], Salvadore Oxford, NP   11 months ago Hyperthyroidism   [place] [facility] [place], Salvadore Oxford, NP   1 year ago Encounter for general adult medical examination with abnormal findings    [facility] [place], Salvadore Oxford, [place]              Passed - Patient is not pregnant      Passed - Last BP in normal range    BP Readings from Last 1 Encounters:  07/29/22 138/70

## 2023-03-21 NOTE — Telephone Encounter (Signed)
Attempted to reach patient to schedule follow-up appointment with PCP. Listed phone number no longer in service.

## 2023-04-04 ENCOUNTER — Other Ambulatory Visit: Payer: Self-pay | Admitting: Internal Medicine

## 2023-04-04 NOTE — Telephone Encounter (Signed)
Requested medications are due for refill today.  yes  Requested medications are on the active medications list.  yes  Last refill. 11/19/2022 #90 0 rf  Future visit scheduled.   no  Notes to clinic.  Pt need an appt. Labs are expired.     Requested Prescriptions  Pending Prescriptions Disp Refills   amLODipine-olmesartan (AZOR) 10-40 MG tablet [Pharmacy Med Name: AMLODIPINE/OLMES MEDOXOM 10-40MG  T] 90 tablet 0    Sig: TAKE 1 TABLET BY MOUTH DAILY     Cardiovascular: CCB + ARB Combos Failed - 04/04/2023 11:33 AM      Failed - K in normal range and within 180 days    Potassium  Date Value Ref Range Status  04/01/2022 3.7 3.5 - 5.3 mmol/L Final         Failed - Cr in normal range and within 180 days    Creat  Date Value Ref Range Status  04/01/2022 1.00 0.60 - 1.00 mg/dL Final         Failed - Na in normal range and within 180 days    Sodium  Date Value Ref Range Status  04/01/2022 144 135 - 146 mmol/L Final         Failed - Valid encounter within last 6 months    Recent Outpatient Visits           11 months ago Laceration of left ankle, initial encounter   [facility] [place], Salvadore Oxford, NP   11 months ago Primary hypertension   [facility] [place], Salvadore Oxford, NP   11 months ago Hypertensive urgency   [place] [facility], Salvadore Oxford, NP   1 year ago Hyperthyroidism   [place] [facility] [place], Salvadore Oxford, NP   1 year ago Encounter for general adult medical examination with abnormal findings   [facility] [place], Salvadore Oxford, [place]              Passed - Patient is not pregnant      Passed - Last BP in normal range    BP Readings from Last 1 Encounters:  07/29/22 138/70

## 2023-05-05 ENCOUNTER — Other Ambulatory Visit: Payer: Self-pay | Admitting: Internal Medicine

## 2023-05-05 DIAGNOSIS — E785 Hyperlipidemia, unspecified: Secondary | ICD-10-CM

## 2023-05-07 NOTE — Telephone Encounter (Signed)
Requested Prescriptions  Pending Prescriptions Disp Refills   rosuvastatin (CRESTOR) 10 MG tablet [Pharmacy Med Name: ROSUVASTATIN 10MG  TABLETS] 90 tablet 0    Sig: TAKE 1 TABLET(10 MG) BY MOUTH DAILY     Cardiovascular:  Antilipid - Statins 2 Failed - 05/05/2023  4:46 PM      Failed - Cr in normal range and within 360 days    Creat  Date Value Ref Range Status  04/01/2022 1.00 0.60 - 1.00 mg/dL Final         Failed - Lipid Panel in normal range within the last 12 months    Cholesterol  Date Value Ref Range Status  04/01/2022 121 <200 mg/dL Final   LDL Cholesterol (Calc)  Date Value Ref Range Status  04/01/2022 48 mg/dL (calc) Final    Comment:    Reference range: <100 . Desirable range <100 mg/dL for primary prevention;   <70 mg/dL for patients with CHD or diabetic patients  with > or = 2 CHD risk factors. Marland Kitchen LDL-C is now calculated using the Martin-Hopkins  calculation, which is a validated novel method providing  better accuracy than the Friedewald equation in the  estimation of LDL-C.  Horald Pollen et al. Lenox Ahr. 7829;562(13): 2061-2068  (http://education.[facility].com/faq/FAQ164)    HDL  Date Value Ref Range Status  04/01/2022 50 > OR = 50 mg/dL Final   Triglycerides  Date Value Ref Range Status  04/01/2022 149 <150 mg/dL Final         Passed - Patient is not pregnant      Passed - Valid encounter within last 12 months    Recent Outpatient Visits           1 year ago Laceration of left ankle, initial encounter   [place] [facility] [place], Salvadore Oxford, NP   1 year ago Primary hypertension   [facility] [place], Salvadore Oxford, NP   1 year ago Hypertensive urgency   [place] [facility], Salvadore Oxford, NP   1 year ago Hyperthyroidism   [place] [facility] [place], Salvadore Oxford, NP   1 year ago Encounter for general adult medical examination with abnormal findings   [place] [facility] [place], Salvadore Oxford, NP

## 2023-08-01 ENCOUNTER — Other Ambulatory Visit: Payer: Self-pay | Admitting: Internal Medicine

## 2023-08-01 DIAGNOSIS — E785 Hyperlipidemia, unspecified: Secondary | ICD-10-CM

## 2023-08-01 NOTE — Telephone Encounter (Signed)
Requested medication (s) are due for refill today: Yes  Requested medication (s) are on the active medication list: Yes  Last refill:  05/07/23  Future visit scheduled: No  Notes to clinic:  Unable to reach pt. To schedule an appointment.    Requested Prescriptions  Pending Prescriptions Disp Refills   rosuvastatin (CRESTOR) 10 MG tablet [Pharmacy Med Name: ROSUVASTATIN 10MG  TABLETS] 90 tablet 0    Sig: TAKE 1 TABLET(10 MG) BY MOUTH DAILY     Cardiovascular:  Antilipid - Statins 2 Failed - 08/01/2023  7:04 AM      Failed - Cr in normal range and within 360 days    Creat  Date Value Ref Range Status  04/01/2022 1.00 0.60 - 1.00 mg/dL Final         Failed - Valid encounter within last 12 months    Recent Outpatient Visits           1 year ago Laceration of left ankle, initial encounter   [place] [facility] [place], Salvadore Oxford, NP   1 year ago Primary hypertension   [place] [facility] [place], Salvadore Oxford, NP   1 year ago Hypertensive urgency   [place] [place], Salvadore Oxford, NP   1 year ago Hyperthyroidism   [place] [facility] [place], Salvadore Oxford, NP   1 year ago Encounter for general adult medical examination with abnormal findings    [facility] [place], [place] W, NP              Failed - Lipid Panel in normal range within the last 12 months    Cholesterol  Date Value Ref Range Status  04/01/2022 121 <200 mg/dL Final   LDL Cholesterol (Calc)  Date Value Ref Range Status  04/01/2022 48 mg/dL (calc) Final    Comment:    Reference range: <100 . Desirable range <100 mg/dL for primary prevention;   <70 mg/dL for patients with CHD or diabetic patients  with > or = 2 CHD risk factors. Marland Kitchen LDL-C is now calculated using the Martin-Hopkins  calculation, which is a validated novel method providing  better accuracy than the Friedewald equation in the  estimation of  LDL-C.  Horald Pollen et al. Lenox Ahr. 1610;960(45): 2061-2068  (http://education.[facility].com/faq/FAQ164)    HDL  Date Value Ref Range Status  04/01/2022 50 > OR = 50 mg/dL Final   Triglycerides  Date Value Ref Range Status  04/01/2022 149 <150 mg/dL Final         Passed - Patient is not pregnant

## 2023-08-07 ENCOUNTER — Other Ambulatory Visit: Payer: Self-pay | Admitting: Internal Medicine

## 2023-08-07 DIAGNOSIS — E785 Hyperlipidemia, unspecified: Secondary | ICD-10-CM

## 2023-08-08 NOTE — Telephone Encounter (Signed)
Rx refused by office- needs appointment Requested Prescriptions  Pending Prescriptions Disp Refills   rosuvastatin (CRESTOR) 10 MG tablet [Pharmacy Med Name: ROSUVASTATIN 10MG  TABLETS] 90 tablet 0    Sig: TAKE 1 TABLET(10 MG) BY MOUTH DAILY     Cardiovascular:  Antilipid - Statins 2 Failed - 08/07/2023 10:50 AM      Failed - Cr in normal range and within 360 days    Creat  Date Value Ref Range Status  04/01/2022 1.00 0.60 - 1.00 mg/dL Final         Failed - Valid encounter within last 12 months    Recent Outpatient Visits           1 year ago Laceration of left ankle, initial encounter   [place] [facility] [place], Salvadore Oxford, NP   1 year ago Primary hypertension   [place] [facility] [place], Salvadore Oxford, NP   1 year ago Hypertensive urgency   [place] [facility] [place], Salvadore Oxford, NP   1 year ago Hyperthyroidism   [place] [facility] [place], Salvadore Oxford, NP   1 year ago Encounter for general adult medical examination with abnormal findings    [facility] [place], [place], NP              Failed - Lipid Panel in normal range within the last 12 months    Cholesterol  Date Value Ref Range Status  04/01/2022 121 <200 mg/dL Final   LDL Cholesterol (Calc)  Date Value Ref Range Status  04/01/2022 48 mg/dL (calc) Final    Comment:    Reference range: <100 . Desirable range <100 mg/dL for primary prevention;   <70 mg/dL for patients with CHD or diabetic patients  with > or = 2 CHD risk factors. Marland Kitchen LDL-C is now calculated using the Martin-Hopkins  calculation, which is a validated novel method providing  better accuracy than the Friedewald equation in the  estimation of LDL-C.  Horald Pollen et al. Lenox Ahr. 1610;960(45): 2061-2068  (http://education.[facility].com/faq/FAQ164)    HDL  Date Value Ref Range Status  04/01/2022 50 > OR = 50 mg/dL Final   Triglycerides  Date Value  Ref Range Status  04/01/2022 149 <150 mg/dL Final         Passed - Patient is not pregnant

## 2023-08-11 ENCOUNTER — Other Ambulatory Visit: Payer: Self-pay

## 2023-08-11 DIAGNOSIS — E785 Hyperlipidemia, unspecified: Secondary | ICD-10-CM

## 2023-08-11 MED ORDER — ROSUVASTATIN CALCIUM 10 MG PO TABS: 10.0000 mg | ORAL_TABLET | Freq: Every day | ORAL | 0 refills | Status: DC

## 2023-09-18 ENCOUNTER — Telehealth: Payer: Self-pay | Admitting: Internal Medicine

## 2023-09-18 NOTE — Telephone Encounter (Signed)
 Called 09/18/2023 to sched AWV - Line Busy   Verlee Rossetti; Care Guide Ambulatory Clinical Support [place] [facility] Direct Dial: [phone]

## 2023-10-30 ENCOUNTER — Other Ambulatory Visit: Payer: Self-pay | Admitting: Internal Medicine

## 2023-10-30 DIAGNOSIS — E785 Hyperlipidemia, unspecified: Secondary | ICD-10-CM

## 2023-10-30 NOTE — Telephone Encounter (Signed)
Requested medications are due for refill today.  yes  Requested medications are on the active medications list.  yes  Last refill. 08/11/2023 #90 0 rf  Future visit scheduled.   no  Notes to clinic.  Labs are expired. Pt needs an OV.    Requested Prescriptions  Pending Prescriptions Disp Refills   rosuvastatin (CRESTOR) 10 MG tablet [Pharmacy Med Name: ROSUVASTATIN 10MG  TABLETS] 90 tablet 0    Sig: TAKE 1 TABLET(10 MG) BY MOUTH DAILY     Cardiovascular:  Antilipid - Statins 2 Failed - 10/30/2023  3:58 PM      Failed - Cr in normal range and within 360 days    Creat  Date Value Ref Range Status  04/01/2022 1.00 0.60 - 1.00 mg/dL Final         Failed - Valid encounter within last 12 months    Recent Outpatient Visits           1 year ago Laceration of left ankle, initial encounter   [place] [facility] [place], Salvadore Oxford, NP   1 year ago Primary hypertension   [place] [facility] [place], Salvadore Oxford, NP   1 year ago Hypertensive urgency   [place] [facility] [place], Salvadore Oxford, NP   1 year ago Hyperthyroidism   [place] [facility] [place], Salvadore Oxford, NP   2 years ago Encounter for general adult medical examination with abnormal findings   [facility] [place], [place] W, NP              Failed - Lipid Panel in normal range within the last 12 months    Cholesterol  Date Value Ref Range Status  04/01/2022 121 <200 mg/dL Final   LDL Cholesterol (Calc)  Date Value Ref Range Status  04/01/2022 48 mg/dL (calc) Final    Comment:    Reference range: <100 . Desirable range <100 mg/dL for primary prevention;   <70 mg/dL for patients with CHD or diabetic patients  with > or = 2 CHD risk factors. Marland Kitchen LDL-C is now calculated using the Martin-Hopkins  calculation, which is a validated novel method providing  better accuracy than the Friedewald equation in the  estimation of LDL-C.   Horald Pollen et al. Lenox Ahr. 0865;784(69): 2061-2068  (http://education.[facility].com/faq/FAQ164)    HDL  Date Value Ref Range Status  04/01/2022 50 > OR = 50 mg/dL Final   Triglycerides  Date Value Ref Range Status  04/01/2022 149 <150 mg/dL Final         Passed - Patient is not pregnant

## 2024-07-19 DIAGNOSIS — Z780 Asymptomatic menopausal state: Secondary | ICD-10-CM | POA: Insufficient documentation

## 2024-07-19 DIAGNOSIS — F32A Depression, unspecified: Secondary | ICD-10-CM | POA: Insufficient documentation

## 2024-07-19 DIAGNOSIS — M170 Bilateral primary osteoarthritis of knee: Secondary | ICD-10-CM | POA: Insufficient documentation

## 2024-07-21 ENCOUNTER — Ambulatory Visit: Attending: Cardiology | Admitting: Cardiology

## 2024-07-28 ENCOUNTER — Encounter: Payer: Self-pay | Admitting: Cardiology

## 2024-07-28 ENCOUNTER — Ambulatory Visit: Attending: Cardiology | Admitting: Cardiology

## 2024-07-28 VITALS — BP 132/76 | HR 67 | Ht 64.0 in | Wt 183.2 lb

## 2024-07-28 DIAGNOSIS — R931 Abnormal findings on diagnostic imaging of heart and coronary circulation: Secondary | ICD-10-CM | POA: Insufficient documentation

## 2024-07-28 DIAGNOSIS — E6609 Other obesity due to excess calories: Secondary | ICD-10-CM

## 2024-07-28 DIAGNOSIS — E66811 Obesity, class 1: Secondary | ICD-10-CM

## 2024-07-28 DIAGNOSIS — E782 Mixed hyperlipidemia: Secondary | ICD-10-CM | POA: Diagnosis not present

## 2024-07-28 DIAGNOSIS — I10 Essential (primary) hypertension: Secondary | ICD-10-CM

## 2024-07-28 DIAGNOSIS — Z6832 Body mass index (BMI) 32.0-32.9, adult: Secondary | ICD-10-CM

## 2024-07-28 NOTE — Progress Notes (Signed)
 Cardiology Office Note:    Date:  07/28/2024   ID:  Leslie Randall, DOB 11-Apr-1951, MRN [id number]  PCP:  Antonette Angeline ORN, NP  Cardiologist:  Jennifer JONELLE Crape, MD   Referring MD: Antonette Angeline ORN, NP    ASSESSMENT:    1. Primary hypertension   2. Class 1 obesity due to excess calories with serious comorbidity and body mass index (BMI) of 32.0 to 32.9 in adult   3. Mixed hyperlipidemia   4. Elevated coronary artery calcium  score    PLAN:    In order of problems listed above:  Elevated calcium  score: Secondary prevention stressed with the patient.  Importance of compliance with diet medication stressed and patient verbalized standing. She was advised to ambulate to the best of her ability.   Essential hypertension: Blood pressure stable and diet was emphasized. Mixed dyslipidemia: On lipid-lowering medications followed by primary care.  Goal LDL must be less than 60. Obesity: Weight reduction stressed diet emphasized and she promises to do better.  I reviewed records including calcium  score reports from the past, echocardiogram reports done previously.  Again patient is asymptomatic with activities of daily living and I do not see any clear indication for any further intervention or evaluation.  Echo at [facility] unremarkable.  I discussed this with her and her sister and questions were answered to their satisfaction. She will be seen in follow-up appointment on a as needed basis.   Medication Adjustments/Labs and Tests Ordered: Current medicines are reviewed at length with the patient today.  Concerns regarding medicines are outlined above.  Orders Placed This Encounter  Procedures   EKG 12-Lead   No orders of the defined types were placed in this encounter.    History of Present Illness:    Leslie Randall is a 73 y.o. female who is being seen today for the evaluation of elevated calcium  score at the request of Antonette Angeline ORN, NP.  Patient is a 73 year old female.  She  has past medical history of mildly elevated calcium  score, essential hypertension, mixed dyslipidemia and obesity.  Overall she leads a sedentary lifestyle.  She mentions to me that she went to the hospital because of stomach upset.  Subsequently she was treated and released.  I reviewed those records.  I asked on multiple times for that she has chest pain orthopnea or PND and she denies this.  She leads a sedentary lifestyle.  When she is active she has no issues.  She tells me that lately she has been living in a shelter for the homeless.  Past Medical History:  Diagnosis Date   Anxiety and depression 06/12/2020   Brain tumor (benign) (HCC)    CAD (coronary artery disease)    CKD (chronic kidney disease) stage 3, GFR 30-59 ml/min (HCC) 03/21/2021   Class 1 obesity due to excess calories with body mass index (BMI) of 32.0 to 32.9 in adult 03/21/2021   Depression    Hyperlipidemia    Hypertension    Hyperthyroidism 03/14/2020   Menopause    Osteoarthritis of both knees    Osteoarthritis of knee 04/01/2022    Past Surgical History:  Procedure Laterality Date   ABDOMINAL HYSTERECTOMY      Current Medications: Current Meds  Medication Sig   amLODipine  (NORVASC) 10 MG tablet Take 10 mg by mouth daily.   chlorthalidone  (HYGROTON ) 25 MG tablet Take 25 mg by mouth at bedtime.   lisinopril  (ZESTRIL ) 20 MG tablet Take 20 mg by  mouth daily.   metoprolol succinate (TOPROL-XL) 50 MG 24 hr tablet Take 50 mg by mouth daily.   rosuvastatin  (CRESTOR ) 10 MG tablet Take 10 mg by mouth daily.   [DISCONTINUED] amLODipine -olmesartan  (AZOR ) 10-40 MG tablet TAKE 1 TABLET BY MOUTH DAILY   [DISCONTINUED] aspirin  EC 81 MG tablet Take 1 tablet (81 mg total) by mouth daily. Swallow whole.     Allergies:   Patient has no known allergies.   Social History   Socioeconomic History   Marital status: Widowed    Spouse name: Not on file   Number of children: 3   Years of education: Not on file   Highest  education level: Not on file  Occupational History   Not on file  Tobacco Use   Smoking status: Former   Smokeless tobacco: Current    Types: Snuff  Vaping Use   Vaping status: Never Used  Substance and Sexual Activity   Alcohol use: Not Currently   Drug use: Never   Sexual activity: Not on file  Other Topics Concern   Not on file  Social History Narrative   Not on file   Social Drivers of Health   Financial Resource Strain: Low Risk  (07/29/2022)   Overall Financial Resource Strain (CARDIA)    Difficulty of Paying Living Expenses: Not hard at all  Food Insecurity: No Food Insecurity (07/29/2022)   Hunger Vital Sign    Worried About Running Out of Food in the Last Year: Never true    Ran Out of Food in the Last Year: Never true  Transportation Needs: No Transportation Needs (07/29/2022)   PRAPARE - Administrator, Civil Service (Medical): No    Lack of Transportation (Non-Medical): No  Physical Activity: Inactive (07/29/2022)   Exercise Vital Sign    Days of Exercise per Week: 0 days    Minutes of Exercise per Session: 0 min  Stress: Stress Concern Present (07/29/2022)   [employer] of Occupational Health - Occupational Stress Questionnaire    Feeling of Stress : To some extent  Social Connections: Socially Isolated (07/29/2022)   Social Connection and Isolation Panel    Frequency of Communication with Friends and Family: More than three times a week    Frequency of Social Gatherings with Friends and Family: More than three times a week    Attends Religious Services: Never    Database Administrator or Organizations: No    Attends Banker Meetings: Not on file    Marital Status: Widowed     Family History: The patient's family history includes Diabetes in her father; Heart disease in her mother; Stroke in her father.  ROS:   Please see the history of present illness.    All other systems reviewed and are negative.  EKGs/Labs/Other  Studies Reviewed:    The following studies were reviewed today:  EKG Interpretation Date/Time:  Wednesday July 28 2024 10:52:57 EST Ventricular Rate:  67 PR Interval:  124 QRS Duration:  110 QT Interval:  448 QTC Calculation: 473 R Axis:   12  Text Interpretation: Sinus rhythm with Premature atrial complexes with Abberant conduction Incomplete right bundle branch block Cannot rule out Anterior infarct , age undetermined Abnormal ECG No previous ECGs available Confirmed by Edwyna Backers [id number]) on 07/28/2024 11:00:13 AM     Recent Labs: No results found for requested labs within last 365 days.  Recent Lipid Panel    Component Value Date/Time   CHOL 121 04/01/2022  9062   TRIG 149 04/01/2022 0937   HDL 50 04/01/2022 0937   CHOLHDL 2.4 04/01/2022 0937   LDLCALC 48 04/01/2022 0937    Physical Exam:    VS:  BP 132/76   Pulse 67   Ht 5' 4 (1.626 m)   Wt 183 lb 3.2 oz (83.1 kg)   SpO2 94%   BMI 31.45 kg/m     Wt Readings from Last 3 Encounters:  07/28/24 183 lb 3.2 oz (83.1 kg)  07/29/22 178 lb 12.8 oz (81.1 kg)  05/07/22 182 lb (82.6 kg)     GEN: Patient is in no acute distress HEENT: Normal NECK: No JVD; No carotid bruits LYMPHATICS: No lymphadenopathy CARDIAC: S1 S2 regular, 2/6 systolic murmur at the apex. RESPIRATORY:  Clear to auscultation without rales, wheezing or rhonchi  ABDOMEN: Soft, non-tender, non-distended MUSCULOSKELETAL:  No edema; No deformity  SKIN: Warm and dry NEUROLOGIC:  Alert and oriented x 3 PSYCHIATRIC:  Normal affect    Signed, Jennifer JONELLE Crape, MD  07/28/2024 11:11 AM    [facility]

## 2024-07-28 NOTE — Patient Instructions (Signed)
 Medication Instructions:  Your physician recommends that you continue on your current medications as directed. Please refer to the Current Medication list given to you today.  *If you need a refill on your cardiac medications before your next appointment, please call your pharmacy*   Lab Work: None ordered If you have labs (blood work) drawn today and your tests are completely normal, you will receive your results only by: MyChart Message (if you have MyChart) OR A paper copy in the mail If you have any lab test that is abnormal or we need to change your treatment, we will call you to review the results.   Testing/Procedures: None ordered   Follow-Up: At [facility], you and your health needs are our priority.  As part of our continuing mission to provide you with exceptional heart care, we have created designated Provider Care Teams.  These Care Teams include your primary Cardiologist (physician) and Advanced Practice Providers (APPs -  Physician Assistants and Nurse Practitioners) who all work together to provide you with the care you need, when you need it.  We recommend signing up for the patient portal called MyChart.  Sign up information is provided on this After Visit Summary.  MyChart is used to connect with patients for Virtual Visits (Telemedicine).  Patients are able to view lab/test results, encounter notes, upcoming appointments, etc.  Non-urgent messages can be sent to your provider as well.   To learn more about what you can do with MyChart, go to [email or web address].    Your next appointment:   Follow up as needed   The format for your next appointment:   In Person  Provider:   Jennifer Crape, MD    Other Instructions none  Important Information About Sugar
